# Patient Record
Sex: Female | Born: 1955
Health system: Southern US, Community
[De-identification: ages and names within clinical notes are randomized; demographics above are authoritative.]

## PROBLEM LIST (undated history)

## (undated) DIAGNOSIS — I341 Nonrheumatic mitral (valve) prolapse: Secondary | ICD-10-CM

## (undated) DIAGNOSIS — Z720 Tobacco use: Secondary | ICD-10-CM

## (undated) DIAGNOSIS — F988 Other specified behavioral and emotional disorders with onset usually occurring in childhood and adolescence: Secondary | ICD-10-CM

## (undated) DIAGNOSIS — I471 Supraventricular tachycardia, unspecified: Secondary | ICD-10-CM

## (undated) DIAGNOSIS — Z22322 Carrier or suspected carrier of Methicillin resistant Staphylococcus aureus: Secondary | ICD-10-CM

## (undated) DIAGNOSIS — M940 Chondrocostal junction syndrome [Tietze]: Secondary | ICD-10-CM

## (undated) DIAGNOSIS — F32A Depression, unspecified: Secondary | ICD-10-CM

## (undated) DIAGNOSIS — J449 Chronic obstructive pulmonary disease, unspecified: Secondary | ICD-10-CM

## (undated) DIAGNOSIS — G5603 Carpal tunnel syndrome, bilateral upper limbs: Secondary | ICD-10-CM

## (undated) DIAGNOSIS — M47817 Spondylosis without myelopathy or radiculopathy, lumbosacral region: Secondary | ICD-10-CM

## (undated) DIAGNOSIS — R002 Palpitations: Secondary | ICD-10-CM

## (undated) DIAGNOSIS — J189 Pneumonia, unspecified organism: Secondary | ICD-10-CM

## (undated) DIAGNOSIS — C3410 Malignant neoplasm of upper lobe, unspecified bronchus or lung: Secondary | ICD-10-CM

## (undated) DIAGNOSIS — IMO0001 Reserved for inherently not codable concepts without codable children: Secondary | ICD-10-CM

## (undated) DIAGNOSIS — G43909 Migraine, unspecified, not intractable, without status migrainosus: Secondary | ICD-10-CM

## (undated) DIAGNOSIS — A4902 Methicillin resistant Staphylococcus aureus infection, unspecified site: Secondary | ICD-10-CM

## (undated) DIAGNOSIS — F329 Major depressive disorder, single episode, unspecified: Secondary | ICD-10-CM

## (undated) DIAGNOSIS — R011 Cardiac murmur, unspecified: Secondary | ICD-10-CM

## (undated) DIAGNOSIS — M47812 Spondylosis without myelopathy or radiculopathy, cervical region: Secondary | ICD-10-CM

## (undated) DIAGNOSIS — I6529 Occlusion and stenosis of unspecified carotid artery: Secondary | ICD-10-CM

## (undated) DIAGNOSIS — K219 Gastro-esophageal reflux disease without esophagitis: Secondary | ICD-10-CM

## (undated) DIAGNOSIS — M797 Fibromyalgia: Secondary | ICD-10-CM

## (undated) HISTORY — DX: Occlusion and stenosis of unspecified carotid artery: I65.29

## (undated) HISTORY — DX: Palpitations: R00.2

## (undated) HISTORY — DX: Spondylosis without myelopathy or radiculopathy, lumbosacral region: M47.817

## (undated) HISTORY — DX: Reserved for inherently not codable concepts without codable children: IMO0001

## (undated) HISTORY — DX: Spondylosis without myelopathy or radiculopathy, cervical region: M47.812

## (undated) HISTORY — DX: Supraventricular tachycardia, unspecified: I47.10

## (undated) HISTORY — DX: Supraventricular tachycardia: I47.1

## (undated) HISTORY — DX: Nonrheumatic mitral (valve) prolapse: I34.1

## (undated) HISTORY — DX: Chronic obstructive pulmonary disease, unspecified: J44.9

## (undated) HISTORY — DX: Tobacco use: Z72.0

## (undated) HISTORY — DX: Fibromyalgia: M79.7

## (undated) HISTORY — DX: Carrier or suspected carrier of methicillin resistant Staphylococcus aureus: Z22.322

## (undated) HISTORY — PX: WISDOM TOOTH EXTRACTION: SHX21

## (undated) HISTORY — PX: CHOLECYSTECTOMY: SHX55

## (undated) HISTORY — DX: Gastro-esophageal reflux disease without esophagitis: K21.9

## (undated) HISTORY — PX: LUMBAR DISC SURGERY: SHX700

## (undated) HISTORY — DX: Other specified behavioral and emotional disorders with onset usually occurring in childhood and adolescence: F98.8

## (undated) HISTORY — DX: Chondrocostal junction syndrome (tietze): M94.0

## (undated) HISTORY — PX: INCISION AND DRAINAGE PERIRECTAL ABSCESS: SHX1804

## (undated) HISTORY — DX: Migraine, unspecified, not intractable, without status migrainosus: G43.909

## (undated) HISTORY — PX: TRIGGER FINGER RELEASE: SHX641

---

## 1999-03-18 ENCOUNTER — Other Ambulatory Visit: Admission: RE | Admit: 1999-03-18 | Discharge: 1999-03-18 | Payer: Self-pay | Admitting: Obstetrics and Gynecology

## 1999-06-15 ENCOUNTER — Other Ambulatory Visit: Admission: RE | Admit: 1999-06-15 | Discharge: 1999-06-15 | Payer: Self-pay | Admitting: Obstetrics and Gynecology

## 1999-12-08 ENCOUNTER — Other Ambulatory Visit: Admission: RE | Admit: 1999-12-08 | Discharge: 1999-12-08 | Payer: Self-pay | Admitting: Family Medicine

## 2000-09-26 ENCOUNTER — Ambulatory Visit (HOSPITAL_COMMUNITY): Admission: RE | Admit: 2000-09-26 | Discharge: 2000-09-26 | Payer: Self-pay | Admitting: Cardiology

## 2000-09-26 ENCOUNTER — Encounter: Payer: Self-pay | Admitting: Cardiology

## 2000-12-20 ENCOUNTER — Other Ambulatory Visit: Admission: RE | Admit: 2000-12-20 | Discharge: 2000-12-20 | Payer: Self-pay | Admitting: Obstetrics and Gynecology

## 2001-08-08 ENCOUNTER — Other Ambulatory Visit: Admission: RE | Admit: 2001-08-08 | Discharge: 2001-08-08 | Payer: Self-pay | Admitting: Obstetrics and Gynecology

## 2002-08-27 ENCOUNTER — Other Ambulatory Visit: Admission: RE | Admit: 2002-08-27 | Discharge: 2002-08-27 | Payer: Self-pay | Admitting: Obstetrics and Gynecology

## 2003-09-16 ENCOUNTER — Other Ambulatory Visit: Admission: RE | Admit: 2003-09-16 | Discharge: 2003-09-16 | Payer: Self-pay | Admitting: Obstetrics and Gynecology

## 2003-09-17 ENCOUNTER — Other Ambulatory Visit: Admission: RE | Admit: 2003-09-17 | Discharge: 2003-09-17 | Payer: Self-pay | Admitting: Obstetrics and Gynecology

## 2004-02-24 ENCOUNTER — Other Ambulatory Visit: Admission: RE | Admit: 2004-02-24 | Discharge: 2004-02-24 | Payer: Self-pay | Admitting: Obstetrics and Gynecology

## 2004-02-24 DIAGNOSIS — R8789 Other abnormal findings in specimens from female genital organs: Secondary | ICD-10-CM | POA: Insufficient documentation

## 2004-08-24 ENCOUNTER — Other Ambulatory Visit: Admission: RE | Admit: 2004-08-24 | Discharge: 2004-08-24 | Payer: Self-pay | Admitting: Obstetrics and Gynecology

## 2005-02-28 ENCOUNTER — Other Ambulatory Visit: Admission: RE | Admit: 2005-02-28 | Discharge: 2005-02-28 | Payer: Self-pay | Admitting: Obstetrics and Gynecology

## 2005-10-09 ENCOUNTER — Inpatient Hospital Stay (HOSPITAL_COMMUNITY): Admission: EM | Admit: 2005-10-09 | Discharge: 2005-10-14 | Payer: Self-pay | Admitting: *Deleted

## 2005-10-09 ENCOUNTER — Ambulatory Visit: Payer: Self-pay | Admitting: Internal Medicine

## 2005-11-15 ENCOUNTER — Ambulatory Visit: Payer: Self-pay | Admitting: Infectious Diseases

## 2006-01-02 ENCOUNTER — Other Ambulatory Visit: Admission: RE | Admit: 2006-01-02 | Discharge: 2006-01-02 | Payer: Self-pay | Admitting: Obstetrics and Gynecology

## 2007-01-08 ENCOUNTER — Encounter (INDEPENDENT_AMBULATORY_CARE_PROVIDER_SITE_OTHER): Payer: Self-pay | Admitting: Internal Medicine

## 2007-01-08 LAB — CONVERTED CEMR LAB: Pap Smear: NORMAL

## 2007-02-06 ENCOUNTER — Encounter (INDEPENDENT_AMBULATORY_CARE_PROVIDER_SITE_OTHER): Payer: Self-pay | Admitting: Internal Medicine

## 2007-02-27 DIAGNOSIS — R928 Other abnormal and inconclusive findings on diagnostic imaging of breast: Secondary | ICD-10-CM | POA: Insufficient documentation

## 2007-04-09 ENCOUNTER — Ambulatory Visit: Payer: Self-pay | Admitting: Internal Medicine

## 2007-04-09 DIAGNOSIS — G5600 Carpal tunnel syndrome, unspecified upper limb: Secondary | ICD-10-CM

## 2007-04-09 LAB — CONVERTED CEMR LAB
Creatinine, Ser: 0.75 mg/dL
Hemoglobin: 17.5 g/dL
LDL Cholesterol: 117 mg/dL
RBC: 5.26 M/uL

## 2007-04-26 ENCOUNTER — Ambulatory Visit: Payer: Self-pay | Admitting: Internal Medicine

## 2007-05-28 ENCOUNTER — Ambulatory Visit: Payer: Self-pay | Admitting: Internal Medicine

## 2007-06-27 ENCOUNTER — Ambulatory Visit: Payer: Self-pay | Admitting: Internal Medicine

## 2007-06-28 ENCOUNTER — Ambulatory Visit (HOSPITAL_COMMUNITY): Admission: RE | Admit: 2007-06-28 | Discharge: 2007-06-28 | Payer: Self-pay | Admitting: Internal Medicine

## 2007-08-09 ENCOUNTER — Telehealth (INDEPENDENT_AMBULATORY_CARE_PROVIDER_SITE_OTHER): Payer: Self-pay | Admitting: Internal Medicine

## 2007-08-10 ENCOUNTER — Encounter (INDEPENDENT_AMBULATORY_CARE_PROVIDER_SITE_OTHER): Payer: Self-pay | Admitting: Internal Medicine

## 2007-08-10 DIAGNOSIS — G43909 Migraine, unspecified, not intractable, without status migrainosus: Secondary | ICD-10-CM | POA: Insufficient documentation

## 2007-08-10 DIAGNOSIS — M5030 Other cervical disc degeneration, unspecified cervical region: Secondary | ICD-10-CM

## 2007-08-10 DIAGNOSIS — F329 Major depressive disorder, single episode, unspecified: Secondary | ICD-10-CM

## 2007-08-10 DIAGNOSIS — K5900 Constipation, unspecified: Secondary | ICD-10-CM | POA: Insufficient documentation

## 2007-08-10 DIAGNOSIS — G894 Chronic pain syndrome: Secondary | ICD-10-CM | POA: Insufficient documentation

## 2007-08-10 DIAGNOSIS — R42 Dizziness and giddiness: Secondary | ICD-10-CM | POA: Insufficient documentation

## 2007-08-10 DIAGNOSIS — I471 Supraventricular tachycardia: Secondary | ICD-10-CM

## 2007-08-19 ENCOUNTER — Telehealth (INDEPENDENT_AMBULATORY_CARE_PROVIDER_SITE_OTHER): Payer: Self-pay | Admitting: Internal Medicine

## 2007-08-23 ENCOUNTER — Ambulatory Visit: Payer: Self-pay | Admitting: Internal Medicine

## 2007-08-23 DIAGNOSIS — J40 Bronchitis, not specified as acute or chronic: Secondary | ICD-10-CM | POA: Insufficient documentation

## 2007-09-09 ENCOUNTER — Telehealth (INDEPENDENT_AMBULATORY_CARE_PROVIDER_SITE_OTHER): Payer: Self-pay | Admitting: Internal Medicine

## 2007-09-23 ENCOUNTER — Telehealth (INDEPENDENT_AMBULATORY_CARE_PROVIDER_SITE_OTHER): Payer: Self-pay | Admitting: *Deleted

## 2007-09-25 ENCOUNTER — Encounter (INDEPENDENT_AMBULATORY_CARE_PROVIDER_SITE_OTHER): Payer: Self-pay | Admitting: Internal Medicine

## 2007-10-15 ENCOUNTER — Encounter (INDEPENDENT_AMBULATORY_CARE_PROVIDER_SITE_OTHER): Payer: Self-pay | Admitting: Internal Medicine

## 2007-10-15 ENCOUNTER — Telehealth (INDEPENDENT_AMBULATORY_CARE_PROVIDER_SITE_OTHER): Payer: Self-pay | Admitting: Internal Medicine

## 2007-10-22 ENCOUNTER — Ambulatory Visit: Payer: Self-pay | Admitting: Internal Medicine

## 2007-10-22 DIAGNOSIS — R071 Chest pain on breathing: Secondary | ICD-10-CM

## 2007-11-05 ENCOUNTER — Telehealth (INDEPENDENT_AMBULATORY_CARE_PROVIDER_SITE_OTHER): Payer: Self-pay | Admitting: Internal Medicine

## 2007-12-10 ENCOUNTER — Encounter (INDEPENDENT_AMBULATORY_CARE_PROVIDER_SITE_OTHER): Payer: Self-pay | Admitting: Internal Medicine

## 2007-12-14 ENCOUNTER — Encounter (INDEPENDENT_AMBULATORY_CARE_PROVIDER_SITE_OTHER): Payer: Self-pay | Admitting: Internal Medicine

## 2007-12-17 ENCOUNTER — Telehealth (INDEPENDENT_AMBULATORY_CARE_PROVIDER_SITE_OTHER): Payer: Self-pay | Admitting: Internal Medicine

## 2008-01-03 ENCOUNTER — Encounter (INDEPENDENT_AMBULATORY_CARE_PROVIDER_SITE_OTHER): Payer: Self-pay | Admitting: Internal Medicine

## 2008-01-13 ENCOUNTER — Encounter (INDEPENDENT_AMBULATORY_CARE_PROVIDER_SITE_OTHER): Payer: Self-pay | Admitting: Internal Medicine

## 2008-10-13 ENCOUNTER — Ambulatory Visit: Payer: Self-pay | Admitting: Hematology & Oncology

## 2008-10-15 LAB — CHCC SATELLITE - SMEAR

## 2008-10-15 LAB — CBC WITH DIFFERENTIAL (CANCER CENTER ONLY)
BASO#: 0.1 10*3/uL (ref 0.0–0.2)
Eosinophils Absolute: 0.2 10*3/uL (ref 0.0–0.5)
HGB: 16.2 g/dL — ABNORMAL HIGH (ref 11.6–15.9)
LYMPH#: 3 10*3/uL (ref 0.9–3.3)
MCH: 34.7 pg — ABNORMAL HIGH (ref 26.0–34.0)
MONO#: 0.4 10*3/uL (ref 0.1–0.9)
NEUT#: 4.8 10*3/uL (ref 1.5–6.5)
Platelets: 327 10*3/uL (ref 145–400)
RBC: 4.67 10*6/uL (ref 3.70–5.32)
WBC: 8.4 10*3/uL (ref 3.9–10.0)

## 2008-10-19 LAB — VITAMIN B12: Vitamin B-12: 361 pg/mL (ref 211–911)

## 2008-10-19 LAB — IRON AND TIBC
%SAT: 18 % — ABNORMAL LOW (ref 20–55)
TIBC: 257 ug/dL (ref 250–470)

## 2008-10-19 LAB — RETICULOCYTES (CHCC)
ABS Retic: 43.3 10*3/uL (ref 19.0–186.0)
RBC.: 4.81 MIL/uL (ref 3.87–5.11)
Retic Ct Pct: 0.9 % (ref 0.4–3.1)

## 2008-10-19 LAB — HEMOGLOBINOPATHY EVALUATION: Hgb A: 95.3 % — ABNORMAL LOW (ref 96.8–97.8)

## 2008-11-23 ENCOUNTER — Ambulatory Visit (HOSPITAL_COMMUNITY): Admission: RE | Admit: 2008-11-23 | Discharge: 2008-11-24 | Payer: Self-pay | Admitting: Neurosurgery

## 2008-12-16 ENCOUNTER — Ambulatory Visit: Payer: Self-pay | Admitting: Hematology & Oncology

## 2008-12-17 LAB — CBC WITH DIFFERENTIAL (CANCER CENTER ONLY)
BASO#: 0.1 10*3/uL (ref 0.0–0.2)
BASO%: 1.1 % (ref 0.0–2.0)
EOS%: 2.4 % (ref 0.0–7.0)
HGB: 15 g/dL (ref 11.6–15.9)
LYMPH#: 3.2 10*3/uL (ref 0.9–3.3)
MCHC: 34.7 g/dL (ref 32.0–36.0)
NEUT#: 5.3 10*3/uL (ref 1.5–6.5)
Platelets: 248 10*3/uL (ref 145–400)

## 2008-12-17 LAB — FERRITIN: Ferritin: 106 ng/mL (ref 10–291)

## 2009-03-17 ENCOUNTER — Encounter: Admission: RE | Admit: 2009-03-17 | Discharge: 2009-03-17 | Payer: Self-pay | Admitting: Neurosurgery

## 2009-04-21 ENCOUNTER — Ambulatory Visit (HOSPITAL_COMMUNITY): Admission: RE | Admit: 2009-04-21 | Discharge: 2009-04-22 | Payer: Self-pay | Admitting: Neurosurgery

## 2009-05-11 ENCOUNTER — Ambulatory Visit: Payer: Self-pay | Admitting: Hematology & Oncology

## 2009-05-11 LAB — CBC WITH DIFFERENTIAL (CANCER CENTER ONLY)
BASO#: 0 10*3/uL (ref 0.0–0.2)
EOS%: 2.1 % (ref 0.0–7.0)
Eosinophils Absolute: 0.2 10*3/uL (ref 0.0–0.5)
HGB: 13.9 g/dL (ref 11.6–15.9)
LYMPH#: 3.1 10*3/uL (ref 0.9–3.3)
MCHC: 33.8 g/dL (ref 32.0–36.0)
NEUT#: 3.9 10*3/uL (ref 1.5–6.5)
Platelets: 275 10*3/uL (ref 145–400)
RBC: 4.15 10*6/uL (ref 3.70–5.32)

## 2009-09-10 ENCOUNTER — Encounter: Admission: RE | Admit: 2009-09-10 | Discharge: 2009-09-10 | Payer: Self-pay | Admitting: Internal Medicine

## 2009-11-10 ENCOUNTER — Encounter: Admission: RE | Admit: 2009-11-10 | Discharge: 2009-11-10 | Payer: Self-pay | Admitting: Obstetrics and Gynecology

## 2010-06-24 ENCOUNTER — Encounter: Admission: RE | Admit: 2010-06-24 | Discharge: 2010-06-24 | Payer: Self-pay | Admitting: Gastroenterology

## 2010-10-07 ENCOUNTER — Emergency Department (HOSPITAL_COMMUNITY): Admission: EM | Admit: 2010-10-07 | Discharge: 2010-10-08 | Payer: Self-pay | Admitting: Emergency Medicine

## 2010-10-07 ENCOUNTER — Ambulatory Visit (HOSPITAL_COMMUNITY): Admission: RE | Admit: 2010-10-07 | Discharge: 2010-10-07 | Payer: Self-pay | Admitting: General Surgery

## 2011-01-03 NOTE — Progress Notes (Signed)
Summary: NEEDS A REF. TO SEE ENT DR  Phone Note Call from Patient   Summary of Call: [name] PT. SAY'S  THAT DR. HICKLING 'S ASSISTANT CALLED HER LAST NIGHT AND SAID THAT SHE MAY HAVE MENIERES DISEASE AND [name] SAYS THAT SHE WOULD LIKE TO SEE DR. BYERS AT [facility] AND SHE NEEDS [place] TO DO THE REFERRAL. Initial call taken by: Leodis Rains,  October 15, 2007 11:29 AM  Follow-up for Phone Call        Left message on answering machine for pt to return call  Follow-up by: Vesta Mixer CMA,  October 16, 2007 2:58 PM  Additional Follow-up for Phone Call Additional follow up Details #1::        Spoke with pt.  Dr. Sharene Skeans wants her to see ENT for possible Meniers Dx.  She has not seen anyone before for this.  She does currently have medicaid.  She also has a f/u appt with you this coming Tuesday.  If it is ok to make ENT appt.  Will you order in EMR. Additional Follow-up by: Vesta Mixer CMA,  October 17, 2007 9:08 AM

## 2011-02-14 LAB — CBC
HCT: 40.8 % (ref 36.0–46.0)
Hemoglobin: 15.8 g/dL — ABNORMAL HIGH (ref 12.0–15.0)
MCH: 33.5 pg (ref 26.0–34.0)
MCV: 97.6 fL (ref 78.0–100.0)
Platelets: 198 10*3/uL (ref 150–400)
RBC: 4.69 MIL/uL (ref 3.87–5.11)
RDW: 12.6 % (ref 11.5–15.5)
WBC: 8.3 10*3/uL (ref 4.0–10.5)

## 2011-02-14 LAB — DIFFERENTIAL
Basophils Absolute: 0 10*3/uL (ref 0.0–0.1)
Basophils Relative: 0 % (ref 0–1)
Lymphocytes Relative: 10 % — ABNORMAL LOW (ref 12–46)
Monocytes Absolute: 0.6 10*3/uL (ref 0.1–1.0)
Neutro Abs: 14.1 10*3/uL — ABNORMAL HIGH (ref 1.7–7.7)
Neutrophils Relative %: 86 % — ABNORMAL HIGH (ref 43–77)

## 2011-02-14 LAB — COMPREHENSIVE METABOLIC PANEL
ALT: 12 U/L (ref 0–35)
AST: 64 U/L — ABNORMAL HIGH (ref 0–37)
Alkaline Phosphatase: 80 U/L (ref 39–117)
BUN: 6 mg/dL (ref 6–23)
CO2: 26 mEq/L (ref 19–32)
CO2: 26 mEq/L (ref 19–32)
Calcium: 8.5 mg/dL (ref 8.4–10.5)
Chloride: 106 mEq/L (ref 96–112)
Creatinine, Ser: 0.68 mg/dL (ref 0.4–1.2)
GFR calc non Af Amer: >60 mL/min (ref >60)
GFR calc non Af Amer: >60 mL/min (ref >60)
Glucose, Bld: 85 mg/dL (ref 70–99)
Potassium: 3.8 mEq/L (ref 3.5–5.1)
Sodium: 138 mEq/L (ref 135–145)

## 2011-02-24 ENCOUNTER — Other Ambulatory Visit: Payer: Self-pay | Admitting: Gastroenterology

## 2011-03-14 LAB — CBC
HCT: 48.5 % — ABNORMAL HIGH (ref 36.0–46.0)
Hemoglobin: 16.6 g/dL — ABNORMAL HIGH (ref 12.0–15.0)
MCHC: 34.3 g/dL (ref 30.0–36.0)
MCV: 99.5 fL (ref 78.0–100.0)
Platelets: 232 10*3/uL (ref 150–400)
RBC: 4.88 MIL/uL (ref 3.87–5.11)
RDW: 13.1 % (ref 11.5–15.5)
WBC: 9.6 10*3/uL (ref 4.0–10.5)

## 2011-04-18 NOTE — Op Note (Signed)
[name], [name]         ACCOUNT NO.:  [id number]   MEDICAL RECORD NO.:  [id number]          PATIENT TYPE:  OIB   LOCATION:  3533                         FACILITY:  [facility]   PHYSICIAN:  Hewitt Shorts, M.D.DATE OF BIRTH:  1956/09/17   DATE OF PROCEDURE:  04/21/2009  DATE OF DISCHARGE:                               OPERATIVE REPORT   PREOPERATIVE DIAGNOSES:  1. Recurrent left L5-S1 lumbar disk herniation.  2. Lumbar degenerative disk disease.  3. Lumbar spondylosis.  4. Lumbar radiculopathy.   POSTOPERATIVE DIAGNOSES:  1. Recurrent left L5-S1 lumbar disk herniation.  2. Lumbar degenerative disk disease.  3. Lumbar spondylosis.  4. Lumbar radiculopathy.   PROCEDURE:  Left L5-S1 lumbar laminotomy and microdiskectomy with  microdissection.   SURGEON:  Hewitt Shorts, MD.   ASSISTANT:  Danae Orleans. Venetia Maxon, MD.   ANESTHESIA:  General endotracheal.   INDICATIONS:  The patient is a 55 year old woman who presents about 5-  month status post a left L5-S1 lumbar laminotomy and microdiskectomy.  She did well when about 2 months ago she developed recurrent left lumbar  radicular pain.  MRI was repeated and revealed a left L5-S1 recurrent  lumbar disk herniation and decision was made to proceed with laminotomy  and microdiskectomy.   PROCEDURE:  The patient was brought to the operating room and placed  under general endotracheal anesthesia.  The patient was turned to a  prone position.  Lumbar region was prepped with Betadine soap and  solution and draped in a sterile fashion.  The midline was infiltrated  with local anesthetic with epinephrine and the previous midline incision  was reopened and incision was carried down through the subcutaneous  tissue.  Bipolar cautery and electrocautery was used to maintain  hemostasis.  Dissection was carried down to the lumbar fascia, which was  incised on the left side of the midline, and the paraspinal muscles were  dissected from  the spinous process and lamina in a subperiosteal  fashion, taking care around the previous laminotomy sites.   The operating microscope was draped and brought to the field to provide  additional navigation, illumination, and visualization.  The remainder  of the decompression was performed using microdissection and  microsurgical technique.  We defined the margins of the previous  laminotomy and began to dissect the scar tissue from the edges of  laminotomy.  We then began our dissection into the lateral epidural  space.  Laminotomy was extended superiorly and laterally using the X-max  drill and Kerrison punches.  We dissected down to the annulus and large  vent in the annulus was present.  We entered into the disk space using a  variety of pituitary rongeurs and began the diskectomy.  The disk  herniation was felt to be medial to the additional exposure and began to  mobilize the herniated disk fragment using coronary dilator and Epstein  microcurettes and were eventually able to mobilize and remove the  recurrent disk herniation, which was embedded within the scar tissue.  A  third diskectomy was performed removing all loose fragments of the disk  material from both the disk space and  the epidural space.  Good  decompression of thecal sac and nerve roots was achieved.  Once the  diskectomy was completed, hemostasis was established with the use of  bipolar cautery and Gelfoam soaked in thrombin.  The Gelfoam was  removed.  Hemostasis reconfirmed.  We had irrigated the wound numerous  times in the procedure with Bacitracin solution.  Once we finished the  procedure and we were about to close, we instilled 2 mL of fentanyl and  80 mg of Depo-Medrol into the epidural space and then proceeded with  closure.  The deep fascia was closed with interrupted undyed #1 Vicryl  sutures.  The subcutaneous and subcuticular were closed with interrupted  inverted 2-0 undyed Vicryl sutures.  The skin  was reapproximated with  Dermabond.  The procedure was tolerated well.  The estimated blood loss  was 25 mL.  Sponge and needle count were correct.  Following surgery,  the patient was returned back to the supine position, to be reversed  from the anesthetic, extubated, and transferred to the recovery room for  further care.      Hewitt Shorts, M.D.  Electronically Signed     RWN/MEDQ  D:  04/21/2009  T:  04/22/2009  Job:  [id number]

## 2011-04-18 NOTE — Op Note (Signed)
[name], [name]         ACCOUNT NO.:  [id number]   MEDICAL RECORD NO.:  [id number]          PATIENT TYPE:  INP   LOCATION:  2899                         FACILITY:  [facility]   PHYSICIAN:  Hewitt Shorts, M.D.DATE OF BIRTH:  12/08/1955   DATE OF PROCEDURE:  11/23/2008  DATE OF DISCHARGE:                               OPERATIVE REPORT   PREOPERATIVE DIAGNOSES:  1. L5-S1 lumbar disk herniation.  2. Lumbar degenerative disk disease  3. Lumbar spondylosis.  4. Lumbar radiculopathy.   POSTOPERATIVE DIAGNOSES:  1. L5-S1 lumbar disk herniation.  2. Lumbar degenerative disk disease.  3. Lumbar spondylosis.  4. Lumbar radiculopathy.   PROCEDURES:  Left L5-S1 lumbar laminotomy and microdiskectomy with  microdissection.   SURGEON:  Hewitt Shorts, MD   ASSISTANT:  Nelia Shi. Webb Silversmith, NP, and Payton Doughty, M.D.   ANESTHESIA:  General endotracheal.   INDICATIONS:  The patient is a 55 year old woman who presented with left  lumbar radiculopathy.  She is found to have small left L5-S1 lumbar disk  herniation which extended caudally on the body of S1 with overlying  hypertrophic facet arthropathy causing left S1 nerve root compression  both ventrally and dorsally.  I did epidural steroid injections which  gave transient but not lasting relief and the decision was made to  proceed with decompression.   PROCEDURE:  The patient was brought to the operating room and placed  under general endotracheal anesthesia.  The patient was turned to a  prone position.  Lumbar region was prepped with Betadine soap and  solution and draped in a sterile fashion.  The midline was infiltrated  with local anesthetic with epinephrine and x-ray was taken.  The L5-S1  level was identified and a midline incision was made over the L5-S1  level and carried down through the subcutaneous tissue.  Bipolar  electrocautery was used to maintain hemostasis.  Dissection was carried  down to the lumbar fascia,  which was incised on the left side of the  midline.  The paraspinal muscles were dissected from the spinous process  and lamina in a subperiosteal fashion. The L5-S1 intralaminar space was  identified and another x-ray was taken to confirm the localization.  And  then, the microscope was draped and brought to the field to provide  additional navigation, visualization. The remainder of the decompression  was performed using microdissection and microsurgical technique.   Laminotomy was performed using the X-Max drill and Kerrison punches.  The ligamentum flavum was carefully removed and we identified the thecal  sac and exiting left S1 nerve root.  The left L5-S1 facet joint was  arthropathic and hypertrophic, and we are able to carefully remove this  because it was causing significant compression of the dorsal lateral  aspect of the nerve root.  We then gently retracted the thecal sac and  nerve root immediately exposing the disk herniation which was  spondylotic in nature as well.  The disk herniation was removed.  We  entered into the disk space, continued with a thorough diskectomy using  variety of microcurettes and pituitary rongeurs.  Good decompression of  thecal sac nerve  root was achieved.  All loose fragments and disk  materials were removed from the disk space and epidural space.  The  wound was irrigated with Bacitracin solution and checked for hemostasis,  which was established and confirmed, and then we instilled 2 mL of  fentanyl and 80 mg of Depo-Medrol into the epidural space and proceeded  with closure.  The deep fascia was closed with interrupted undyed #1  Vicryl sutures. Scarpa fascia was closed with interrupted undyed #1  Vicryl sutures. The subcutaneous and subcuticular were closed with  interrupted inverted 2-0 undyed Vicryl sutures. The skin was  approximated with Dermabond. The procedure was tolerated well. Estimated  blood loss was 10 mL.  Sponge and needle count  were correct. Following  surgery, the patient was turned back to the supine position to be  reversed from the anesthetic, extubated, and transferred to the recovery  room for further care.      Hewitt Shorts, M.D.  Electronically Signed     RWN/MEDQ  D:  11/23/2008  T:  11/23/2008  Job:  [id number]

## 2011-04-21 NOTE — Discharge Summary (Signed)
[name], [name]         ACCOUNT NO.:  [id number]   MEDICAL RECORD NO.:  [id number]          PATIENT TYPE:  INP   LOCATION:  1619                         FACILITY:  [facility]   PHYSICIAN:  [name], MD      DATE OF BIRTH:  Dec 08, 1955   DATE OF ADMISSION:  10/09/2005  DATE OF DISCHARGE:  10/14/2005                                 DISCHARGE SUMMARY   REASON FOR ADMISSION:  Perirectal abscess with cellulitis.   HISTORY OF PRESENT ILLNESS:  Patient is a 55 year old white female from  [place], [place] referred to the emergency department by Dr. Henderson Cloud  at [facility] for perirectal abscess and cellulitis. The patient  had symptoms for five to six days prior to admission. The patient was  evaluated by Dr. Mariel Aloe. She was found to have an elevated white blood  cell count of 20,000. General surgery was consulted.   The patient was admitted on the general surgical service, taken directly to  the operating room where she underwent incision and drainage of a large  perirectal abscess. Postoperatively, she was treated with intravenous  antibiotics. Culture results revealed methicillin-resistant staph aureus.  She was seen in consultation by Dr. Cliffton Asters from infectious disease.  The patient's antibiotics were changed from intravenous Unasyn to  intravenous vancomycin. The patient defervesced. White blood cell count  returned to normal. She was prepared for discharge home on November 11.   DISCHARGE/PLAN:  The patient is discharged home October 14, 2005 in good  condition, tolerating a regular diet, and ambulating independently. [facility]  [facility] home health nurses will assist with dressing changes at home on a  daily basis. The patient's discharge medications include doxycycline 100 mg  p.o. b.i.d. for 14 days and Percocet as needed for pain.   FINAL DIAGNOSIS:  Perirectal abscess, methicillin-resistant staph aureus,  cellulitis.   CONDITION AT  DISCHARGE:  Improved.      [name], MD  Electronically Signed     TMG/MEDQ  D:  10/14/2005  T:  10/14/2005  Job:  [id number]   cc:   Carrington Clamp, M.D.  Fax: [phone]   Sheppard Penton. Stacie Acres, M.D.  Fax: [phone]   Cliffton Asters, M.D.  Fax: [phone]

## 2011-04-21 NOTE — H&P (Signed)
[name], [name]         ACCOUNT NO.:  [id number]   MEDICAL RECORD NO.:  [id number]          PATIENT TYPE:  EMS   LOCATION:  ED                           FACILITY:  [facility]   PHYSICIAN:  [name], MD      DATE OF BIRTH:  05-24-1956   DATE OF ADMISSION:  10/09/2005  DATE OF DISCHARGE:                                HISTORY & PHYSICAL   REFERRED BY:  Sheppard Penton. Stacie Acres, M.D., emergency department.   CHIEF COMPLAINT:  Perirectal abscess, cellulitis.   HISTORY OF PRESENT ILLNESS:  The patient is 55 year old white female from  [place], [place] who presents to the emergency department after  evaluation at the office of Dr. Henderson Cloud at [facility] for what  appears to be a perirectal abscess and cellulitis. The patient apparently  has had 5-6 days of developing symptoms. She had increased pain. She  developed fevers and chills. She saw Dr. Henderson Cloud at [facility]  today and was referred to the emergency department at [facility]. The patient was assessed by Dr. Stacie Acres. Laboratory studies  showed  a white count of 20,000 with 92% neutrophils. The patient has a fever to  101.4 degrees. General surgery is now called for evaluation of what appears  to be a large left perirectal abscess.   The patient has had previous soft-tissue and skin infections over the past  year. She has been on and off of antibiotics. The patient denies any  anorectal disease.   PAST MEDICAL HISTORY:  History of mitral valve prolapse followed by Dr. Caprice Kluver, history of migraine headaches, history of laparoscopy for abdominal  pain.   MEDICATIONS:  Fioricet, ibuprofen, Cymbalta, Xanax.   ALLERGIES:  CARAFATE, CECLOR, MACRODANTIN.   SOCIAL HISTORY:  [place] She is  accompanied by her mother. She smokes cigarettes. She drinks alcohol. She  denies drug use.   REVIEW OF SYMPTOMS:  A 15 system review without significant other  positives  except as noted above.   PHYSICAL EXAMINATION:  GENERAL:  A 55 year old, well-developed, white female 55 on a stretcher in the emergency department.  VITAL SIGNS:  Temperature 101.4, blood pressure 89/60, pulse 129,  respirations 23, O2 saturation 97%.  HEENT:  Shows her to be normocephalic, atraumatic. Sclerae clear.  Conjunctiva clear. Pupils equal and reactive. Dentition fair to poor, mucous  membranes moist. Voice is normal. Palpation of the neck shows no thyroid  nodularity. There is no anterior or posterior cervical lymphadenopathy.  LUNGS:  Clear to auscultation bilaterally without rales, rhonchi or wheeze.  CARDIAC:  Shows tachycardia without significant murmur. Peripheral pulses  are full.  ABDOMEN:  Soft, nontender, scaphoid with bowel sounds present. PERINEUM:  Examination of the a perineum shows a large indurated, erythematous mass  involving the medial left buttock. This was exquisitely tender. It is  difficult to assess whether there is fluctuance but there is significant  induration. Erythema extends into the left labia majora with moderate soft  tissue swelling. There are multiple petechial lesions around the left thigh  and groin region.  EXTREMITIES:  Nontender without edema.  NEUROLOGIC:  Neurologically the patient is alert and oriented without focal  deficit.   LABORATORY STUDIES:  White count 20.3, hemoglobin 13.6, hematocrit 38.1%,  platelet count 212,000. Differential shows 92% neutrophils, 3% lymphocytes,  5% monocytes. Chemistry profile notable for sodium of 130 and potassium of  3.0 and creatinine of 1.6.   IMPRESSION:  Perirectal abscess, cellulitis.   PLAN:  The patient be admitted on the general surgical service. She has been  started on Unasyn intravenously in the emergency department. After  discussion with the patient and her family she will be prepared for the  operating room to undergo examination under anesthesia and incision and   drainage of perirectal abscess. The patient will likely require 24-48 hours  of hospitalization for intravenous antibiotic administration and wound care.  The patient understands and wishes to proceed.      [name], MD  Electronically Signed     TMG/MEDQ  D:  10/09/2005  T:  10/09/2005  Job:  [id number]   cc:   Carrington Clamp, M.D.  Fax: [phone]

## 2011-04-21 NOTE — Op Note (Signed)
[name], FIGLER         ACCOUNT NO.:  [id number]   MEDICAL RECORD NO.:  [id number]          PATIENT TYPE:  INP   LOCATION:  0101                         FACILITY:  [facility]   PHYSICIAN:  [name], MD      DATE OF BIRTH:  08-Jun-1956   DATE OF PROCEDURE:  10/09/2005  DATE OF DISCHARGE:                                 OPERATIVE REPORT   PREOPERATIVE DIAGNOSIS:  Perirectal abscess, cellulitis.   POSTOPERATIVE DIAGNOSIS:  Perirectal abscess, cellulitis.   PROCEDURE:  Incision, drainage and open packing of perirectal abscess.   SURGEON:  [name], MD.   ANESTHESIA:  General per Dr. Lucille Passy, MD.   PREPARATION:  Betadine.   COMPLICATIONS:  None.   BLOOD LOSS:  Minimal.   INDICATIONS:  The patient is a 55 year old white female from [place], [place] who presents with 5-6 day history of developing infection in the  perineum. She was seen by Dr. Henderson Cloud at [facility] and sent to the  emergency department. The patient was seen by Dr. Mariel Aloe and general  surgery was consulted for management.   DESCRIPTION OF PROCEDURE:  The procedure was done in OR #1 at the [facility]. The patient is brought to the operating room,  placed in a supine position on the operating room table. Following the  administration of general anesthesia, the patient is placed in lithotomy.  There is obvious cellulitis involving the left medial thigh, the left labia,  and extending into the left medial buttock. Digital rectal exam shows  fluctuance along the left lateral rectal wall. An area on the skin which  appears to be mildly fluctuant is selected on the left buttock. This is  incised with the electrocautery. Thick yellow brown pus is evident. The  incision is extended for 3 cm. Using a digital exam, multiple loculations of  a very large perirectal abscess are broken up. The abscess extends upwards  along the rectal wall above the level of  the pelvic levators. It extends  anteriorly into the left labia. It extends posteriorly towards the coccyx in  the perirectal tissues. Aerobic and anaerobic cultures are taken and  submitted to  the laboratory. The wound was irrigated copiously with warm saline. The  cavity is then packed with 1-inch Iodoform gauze packing and dry gauze  dressings and ABD pads are placed. The patient is taken out of lithotomy and  brought to the recovery room. The patient tolerated the procedure well.      [name], MD  Electronically Signed     TMG/MEDQ  D:  10/09/2005  T:  10/10/2005  Job:  [id number]   cc:   Carrington Clamp, M.D.  Fax: [phone]

## 2011-05-12 ENCOUNTER — Other Ambulatory Visit: Payer: Self-pay | Admitting: Obstetrics and Gynecology

## 2011-09-08 LAB — BASIC METABOLIC PANEL
BUN: 9 mg/dL (ref 6–23)
CO2: 29 mEq/L (ref 19–32)
Calcium: 9.4 mg/dL (ref 8.4–10.5)
Chloride: 99 mEq/L (ref 96–112)
Creatinine, Ser: 0.63 mg/dL (ref 0.4–1.2)
GFR calc Af Amer: >60 mL/min (ref >60)
GFR calc non Af Amer: >60 mL/min (ref >60)
Glucose, Bld: 84 mg/dL (ref 70–99)
Potassium: 3.8 mEq/L (ref 3.5–5.1)
Sodium: 136 mEq/L (ref 135–145)

## 2011-09-08 LAB — CBC
HCT: 49.5 % — ABNORMAL HIGH (ref 36.0–46.0)
Hemoglobin: 16.4 g/dL — ABNORMAL HIGH (ref 12.0–15.0)
MCHC: 33.2 g/dL (ref 30.0–36.0)
MCV: 101.5 fL — ABNORMAL HIGH (ref 78.0–100.0)
Platelets: 242 10*3/uL (ref 150–400)
RBC: 4.87 MIL/uL (ref 3.87–5.11)
RDW: 13.4 % (ref 11.5–15.5)
WBC: 10.6 10*3/uL — ABNORMAL HIGH (ref 4.0–10.5)

## 2012-09-10 LAB — PULMONARY FUNCTION TEST

## 2013-03-05 ENCOUNTER — Encounter: Payer: Self-pay | Admitting: *Deleted

## 2013-03-05 ENCOUNTER — Encounter: Payer: Self-pay | Admitting: Cardiovascular Disease

## 2013-04-18 ENCOUNTER — Encounter: Payer: Self-pay | Admitting: Nurse Practitioner

## 2013-04-18 ENCOUNTER — Ambulatory Visit (INDEPENDENT_AMBULATORY_CARE_PROVIDER_SITE_OTHER): Payer: Medicare Other | Admitting: Nurse Practitioner

## 2013-04-18 VITALS — BP 121/73 | HR 85 | Ht 68.0 in | Wt 172.0 lb

## 2013-04-18 DIAGNOSIS — IMO0001 Reserved for inherently not codable concepts without codable children: Secondary | ICD-10-CM | POA: Insufficient documentation

## 2013-04-18 DIAGNOSIS — G43909 Migraine, unspecified, not intractable, without status migrainosus: Secondary | ICD-10-CM

## 2013-04-18 DIAGNOSIS — M47812 Spondylosis without myelopathy or radiculopathy, cervical region: Secondary | ICD-10-CM | POA: Insufficient documentation

## 2013-04-18 MED ORDER — FIORICET 50-300-40 MG PO CAPS: ORAL_CAPSULE | ORAL | Status: AC

## 2013-04-18 NOTE — Patient Instructions (Addendum)
Patient given Fioricet prescription for one month, Pain management will manage her prescriptions from now on Neurologic exam completely normal, Previous MRI of the brain normal Previous EEG was normal Followup when necessary

## 2013-04-18 NOTE — Progress Notes (Signed)
HPI: Patient returns for followup after last visit with Dr. Terrace Arabia 10/16/2012. Patient was previously seen by Dr. Sharene Skeans who has left the practice. She has multiple medical problems  including chronic intractable migraine, depression and anxiety, chronic pain syndrome that is managed by pain management, and fibromyalgia. Her headaches when present are severe persistent and pounding, she has taken multiple preventives in the past. Fioricet is the only medication that has helped her. She has been on triptans and anti-seizure medications in the past without benefit. Some of her headaches originate in the shoulder and neck area.  ROS:  weight gain, fatigue, chest pain, palpitations, murmur, ringing in ears, spinning sensation, moles, constipation, easy brusing, feeling hot, feeling cold, increased thrist, joint swelling, aching muscles, memory loss, headache, numbness, sleepiness, dizziness, passing out, depression, too much sleep, decreased energy, change in appetite, disinterest in activities  Physical Exam General: well developed, well nourished, seated, in no evident distress Head: head normocephalic and atraumatic. Oropharynx benign Neck: supple with no carotid or supraclavicular bruits Cardiovascular: regular rate and rhythm, no murmurs  Neurologic Exam Mental Status: Awake and fully alert. Oriented to place and time. Follows all commands Mood and affect appropriate.  Cranial Nerves: Fundoscopic exam reveals sharp disc margins. Pupils equal, briskly reactive to light. Extraocular movements full without nystagmus. Visual fields full to confrontation. Hearing intact and symmetric to finger snap. Facial sensation intact. Face, tongue, palate move normally and symmetrically.   Motor: Normal bulk and tone. Normal strength in all tested extremity muscles. No focal weakness Sensory.: intact to touch and pinprick and vibratory in all extremities.  Coordination: Rapid alternating movements normal in all  extremities. Finger-to-nose and heel-to-shin performed accurately bilaterally. Gait and Station: Arises from chair without difficulty. Stance is normal. Gait demonstrates normal stride length and balance . Able to heel, toe and tandem walk without difficulty.  Reflexes: 2+ and symmetric. Toes downgoing.     ASSESSMENT: Intractable mixed headache disorder with her symptomology being stable over time. Multiple preventives have been tried in the past and  failed to relieve her headaches. She is wishing to transfer her Fioricet prescription to the pain clinic if that is okay with this clinic. She has a normal neurologic exam     PLAN:  Patient given Fioricet prescription for one month, Pain management will manage her prescriptions from now on Neurologic exam completely normal, Previous MRI of the brain normal Previous EEG was normal Followup when necessary for other neurologic problems  Nilda Riggs, GNP-BC APRN

## 2013-09-17 ENCOUNTER — Other Ambulatory Visit: Payer: Self-pay | Admitting: Neurosurgery

## 2013-09-17 DIAGNOSIS — M47816 Spondylosis without myelopathy or radiculopathy, lumbar region: Secondary | ICD-10-CM

## 2013-09-17 DIAGNOSIS — M47812 Spondylosis without myelopathy or radiculopathy, cervical region: Secondary | ICD-10-CM

## 2013-10-08 ENCOUNTER — Ambulatory Visit
Admission: RE | Admit: 2013-10-08 | Discharge: 2013-10-08 | Disposition: A | Discharge status: Home / Self Care | Payer: Medicare Other | Source: Ambulatory Visit | Attending: Neurosurgery | Admitting: Neurosurgery

## 2013-10-08 DIAGNOSIS — M47816 Spondylosis without myelopathy or radiculopathy, lumbar region: Secondary | ICD-10-CM

## 2013-10-08 DIAGNOSIS — M47812 Spondylosis without myelopathy or radiculopathy, cervical region: Secondary | ICD-10-CM

## 2013-10-08 MED ORDER — GADOBENATE DIMEGLUMINE 529 MG/ML IV SOLN
15.0000 mL | Freq: Once | INTRAVENOUS | Status: AC | PRN
  Administered 2013-10-08: 15 mL via INTRAVENOUS

## 2013-12-05 ENCOUNTER — Telehealth (HOSPITAL_COMMUNITY): Payer: Self-pay | Admitting: *Deleted

## 2013-12-15 ENCOUNTER — Other Ambulatory Visit (HOSPITAL_COMMUNITY): Payer: Self-pay | Admitting: Cardiovascular Disease

## 2013-12-15 ENCOUNTER — Telehealth (HOSPITAL_COMMUNITY): Payer: Self-pay | Admitting: *Deleted

## 2014-01-08 ENCOUNTER — Encounter (HOSPITAL_COMMUNITY): Payer: Medicare Other

## 2014-01-19 ENCOUNTER — Ambulatory Visit (HOSPITAL_COMMUNITY)
Admission: RE | Admit: 2014-01-19 | Discharge: 2014-01-19 | Disposition: A | Discharge status: Home / Self Care | Payer: Medicare Other | Source: Ambulatory Visit | Attending: Internal Medicine | Admitting: Internal Medicine

## 2014-01-19 DIAGNOSIS — I6529 Occlusion and stenosis of unspecified carotid artery: Secondary | ICD-10-CM | POA: Insufficient documentation

## 2014-01-19 NOTE — Progress Notes (Signed)
Carotid Duplex Completed. Laportia Carley, BS, RDMS, RVT  

## 2014-01-23 ENCOUNTER — Telehealth: Payer: Self-pay | Admitting: *Deleted

## 2014-01-23 DIAGNOSIS — I6529 Occlusion and stenosis of unspecified carotid artery: Secondary | ICD-10-CM

## 2014-01-23 NOTE — Telephone Encounter (Signed)
Carotid doppler results called to patient.  Voiced understanding.  Will repeat in one year.  Order placed.

## 2014-01-23 NOTE — Telephone Encounter (Signed)
Message copied by Tressa Busman on Fri Jan 23, 2014  8:44 AM ------      Message from: Sanda Klein      Created: Thu Jan 22, 2014  7:45 PM       Carotids with mild blockage, maybe a little worse than last year on the left. Repeat in 12 months please ------

## 2014-03-02 ENCOUNTER — Ambulatory Visit: Payer: Medicare Other | Admitting: Cardiovascular Disease

## 2014-04-07 ENCOUNTER — Ambulatory Visit (INDEPENDENT_AMBULATORY_CARE_PROVIDER_SITE_OTHER): Payer: Medicare Other | Admitting: Cardiovascular Disease

## 2014-04-07 ENCOUNTER — Encounter: Payer: Self-pay | Admitting: Cardiovascular Disease

## 2014-04-07 VITALS — BP 122/78 | HR 69 | Resp 16 | Ht 67.0 in | Wt 166.4 lb

## 2014-04-07 DIAGNOSIS — E789 Disorder of lipoprotein metabolism, unspecified: Secondary | ICD-10-CM

## 2014-04-07 DIAGNOSIS — Z8249 Family history of ischemic heart disease and other diseases of the circulatory system: Secondary | ICD-10-CM

## 2014-04-07 DIAGNOSIS — I471 Supraventricular tachycardia: Secondary | ICD-10-CM

## 2014-04-07 NOTE — Patient Instructions (Signed)
Your physician recommends that you schedule a follow-up appointment in: 12 months with Dr.Croitoru

## 2014-04-08 ENCOUNTER — Encounter: Payer: Self-pay | Admitting: Cardiovascular Disease

## 2014-04-08 DIAGNOSIS — Z8249 Family history of ischemic heart disease and other diseases of the circulatory system: Secondary | ICD-10-CM | POA: Insufficient documentation

## 2014-04-08 DIAGNOSIS — E789 Disorder of lipoprotein metabolism, unspecified: Secondary | ICD-10-CM | POA: Insufficient documentation

## 2014-04-08 NOTE — Progress Notes (Signed)
Patient ID: [name], female   DOB: 1956-01-06, 58 y.o.   MRN: [id number]      Reason for office visit Atrial tachycardia  [name] returns in followup for palpitations related to ectopic atrial tachycardia. She also has a history of migraines, fibromyalgia, mild dyslipidemia and a very strong family history of coronary disease. She is trying to quit smoking. She is now "vaping" and this has helped her to reduce her cigarette use a roughly 4 a day.  He has not been troubled by sustained palpitations she has very rare episodes of "hop skip and jump". She has not had syncope, dizziness, shortness of breath, chest pain or any other cardiac symptoms. He had a normal nuclear stress test in 2013.  Previous pulmonary function tests have shown evidence of abnormal lung function with moderate obstructive abnormalities. FEV1 was 64% of predicted  She is still taking red yeast rice for hyperlipidemia.   Allergies  Allergen Reactions  . Carafate [Sucralfate]   . Cefaclor     REACTION: Hives and swelling  . Nitrofurantoin     REACTION: Hives and swelling  . Wellbutrin [Bupropion]     Current Outpatient Prescriptions  Medication Sig Dispense Refill  . Ascorbic Acid (VITAMIN C) 1000 MG tablet Take 1,000 mg by mouth 2 (two) times daily.      . Calcium Carbonate-Vit D-Min (CALCIUM 1200 PO) Take 1,200 mg by mouth 2 (two) times daily.      Marland Kitchen dexlansoprazole (DEXILANT) 60 MG capsule Take 60 mg by mouth daily.      . diclofenac (CATAFLAM) 50 MG tablet Take 50 mg by mouth 2 (two) times daily.      Marland Kitchen docusate sodium (COLACE) 100 MG capsule Take 100 mg by mouth 2 (two) times daily as needed for constipation.      . DULoxetine (CYMBALTA) 60 MG capsule Take 60 mg by mouth daily.      Marland Kitchen FIORICET 50-300-40 MG CAPS 1 to 2 every 4-6 hrs as needed  60 capsule  0  . fish oil-omega-3 fatty acids 1000 MG capsule Take 2 g by mouth 2 (two) times daily.      . Flavocoxid (LIMBREL) 500 MG CAPS Take by mouth  2 (two) times daily.      Marland Kitchen guaiFENesin (MUCINEX) 600 MG 12 hr tablet Take 1,200 mg by mouth 2 (two) times daily as needed for congestion.      . Multiple Vitamin (MULTIVITAMIN WITH MINERALS) TABS Take 1 tablet by mouth daily.      . nortriptyline (PAMELOR) 10 MG capsule Take 20 mg by mouth at bedtime.      . ondansetron (ZOFRAN) 4 MG tablet       . oxyCODONE-acetaminophen (PERCOCET) 7.5-325 MG per tablet Take 1 tablet by mouth every 4 (four) hours as needed for pain (1-3 tabs prn daily).       . pregabalin (LYRICA) 50 MG capsule Take 50 mg by mouth 4 (four) times daily.       . pregabalin (LYRICA) 75 MG capsule Take 75 mg by mouth 3 (three) times daily.      . Red Yeast Rice 600 MG CAPS Take 1,200 mg by mouth 2 (two) times daily.      Marland Kitchen tiZANidine (ZANAFLEX) 2 MG tablet Take 6 mg by mouth 3 (three) times daily.      . TURMERIC PO Take by mouth 3 (three) times daily.       No current facility-administered medications for this visit.  Past Medical History  Diagnosis Date  . Palpitations   . SVT (supraventricular tachycardia)     very brief runs  . Migraine   . Tobacco abuse   . COPD (chronic obstructive pulmonary disease)   . Cervical spondylosis without myelopathy   . Myalgia and myositis, unspecified   . Lumbosacral spondylosis without myelopathy     Past Surgical History  Procedure Laterality Date  . Wisdom tooth extraction      Family History  Problem Relation Age of Onset  . Hyperlipidemia Mother   . Hypertension Mother   . Hyperlipidemia Father   . Hypertension Father   . Diabetes Father   . Hypertension Brother   . Hyperlipidemia Brother     History   Social History  . Marital Status: Divorced    Spouse Name: N/A    Number of Children: N/A  . Years of Education: N/A   Occupational History  . Not on file.   Social History Main Topics  . Smoking status: Current Every Day Smoker -- 1.50 packs/day    Types: Cigarettes  . Smokeless tobacco: Never Used  .  Alcohol Use: No  . Drug Use: No  . Sexual Activity: Not on file   Other Topics Concern  . Not on file   Social History Narrative  . No narrative on file    Review of systems: The patient specifically denies any chest pain at rest or with exertion, dyspnea at rest or with exertion, orthopnea, paroxysmal nocturnal dyspnea, syncope, palpitations, focal neurological deficits, intermittent claudication, lower extremity edema, unexplained weight gain, cough, hemoptysis or wheezing.  The patient also denies abdominal pain, nausea, vomiting, dysphagia, diarrhea, constipation, polyuria, polydipsia, dysuria, hematuria, frequency, urgency, abnormal bleeding or bruising, fever, chills, unexpected weight changes, mood swings, change in skin or hair texture, change in voice quality, auditory or visual problems, allergic reactions or rashes, new musculoskeletal complaints other than usual "aches and pains".   PHYSICAL EXAM BP 122/78  Pulse 69  Resp 16  Ht '5\' 7"'  (1.702 m)  Wt 166 lb 6.4 oz (75.479 kg)  BMI 26.06 kg/m2  General: Alert, oriented x3, no distress Head: no evidence of trauma, PERRL, EOMI, no exophtalmos or lid lag, no myxedema, no xanthelasma; normal ears, nose and oropharynx Neck: normal jugular venous pulsations and no hepatojugular reflux; brisk carotid pulses without delay and no carotid bruits Chest: clear to auscultation, no signs of consolidation by percussion or palpation, normal fremitus, symmetrical and full respiratory excursions Cardiovascular: normal position and quality of the apical impulse, regular rhythm, normal first and second heart sounds, no murmurs, rubs or gallops Abdomen: no tenderness or distention, no masses by palpation, no abnormal pulsatility or arterial bruits, normal bowel sounds, no hepatosplenomegaly Extremities: no clubbing, cyanosis or edema; 2+ radial, ulnar and brachial pulses bilaterally; 2+ right femoral, posterior tibial and dorsalis pedis pulses; 2+  left femoral, posterior tibial and dorsalis pedis pulses; no subclavian or femoral bruits Neurological: grossly nonfocal   EKG: Normal sinus rhythm, minor nonspecific ST segment abnormality, no changes, QTC 383 ms  Lipid Panel     Component Value Date/Time   CHOL 182 04/09/2007   TRIG 68 04/09/2007   HDL 51 04/09/2007   LDLCALC 117 04/09/2007    BMET    Component Value Date/Time   NA 136 10/08/2010 0005   K 3.6 10/08/2010 0005   CL 105 10/08/2010 0005   CO2 26 10/08/2010 0005   GLUCOSE 102* 10/08/2010 0005   BUN 6 10/08/2010  0005   CREATININE 0.68 10/08/2010 0005   CALCIUM 8.5 10/08/2010 0005   GFRNONAA >60 10/08/2010 0005   GFRAA  Value: >60        The eGFR has been calculated using the MDRD equation. This calculation has not been validated in all clinical situations. eGFR's persistently <60 mL/min signify possible Chronic Kidney Disease. 10/08/2010 0005     ASSESSMENT AND PLAN PSVT Rare palpitations, presumably related to recurrent paroxysmal atrial tachycardia. She is minimally symptomatic and more intense/specific therapy does not appear indicated.  Family history of premature CAD I encouraged her strongly to completely quit cigarettes. She should make sure that she doesn't use the electronic cigarettes long-term, but rather as a bridge to complete cessation. She is expresses some true interest and I think will try to quit.  Lipid disorder Although her lipid levels are not optimal, specifically her LDL is a little higher than desirable, no additional pharmacological therapy is indicated at this time; healthy diet and exercise were discussed   Orders Placed This Encounter  Procedures  . EKG 12-Lead   Meds ordered this encounter  Medications  . DULoxetine (CYMBALTA) 60 MG capsule    Sig: Take 60 mg by mouth daily.  Marland Kitchen tiZANidine (ZANAFLEX) 2 MG tablet    Sig: Take 6 mg by mouth 3 (three) times daily.  . pregabalin (LYRICA) 75 MG capsule    Sig: Take 75 mg by mouth 3 (three) times  daily.  . diclofenac (CATAFLAM) 50 MG tablet    Sig: Take 50 mg by mouth 2 (two) times daily.  . nortriptyline (PAMELOR) 10 MG capsule    Sig: Take 20 mg by mouth at bedtime.    Lynnley Doddridge  Sanda Klein, MD, [facility] [phone] office [phone] pager

## 2014-04-08 NOTE — Assessment & Plan Note (Signed)
I encouraged her strongly to completely quit cigarettes. She should make sure that she doesn't use the electronic cigarettes long-term, but rather as a bridge to complete cessation. She is expresses some true interest and I think will try to quit.

## 2014-04-08 NOTE — Assessment & Plan Note (Signed)
Although her lipid levels are not optimal, specifically her LDL is a little higher than desirable, no additional pharmacological therapy is indicated at this time; healthy diet and exercise were discussed

## 2014-04-08 NOTE — Assessment & Plan Note (Signed)
Rare palpitations, presumably related to recurrent paroxysmal atrial tachycardia. She is minimally symptomatic and more intense/specific therapy does not appear indicated.

## 2014-11-15 ENCOUNTER — Encounter (HOSPITAL_COMMUNITY): Payer: Self-pay | Admitting: Emergency Medicine

## 2014-11-15 ENCOUNTER — Emergency Department (HOSPITAL_COMMUNITY)
Admission: EM | Admit: 2014-11-15 | Discharge: 2014-11-15 | Discharge status: Against Medical Advice | Payer: Medicare Other | Attending: Emergency Medicine | Admitting: Emergency Medicine

## 2014-11-15 DIAGNOSIS — G43909 Migraine, unspecified, not intractable, without status migrainosus: Secondary | ICD-10-CM | POA: Diagnosis not present

## 2014-11-15 DIAGNOSIS — Z720 Tobacco use: Secondary | ICD-10-CM | POA: Insufficient documentation

## 2014-11-15 DIAGNOSIS — J449 Chronic obstructive pulmonary disease, unspecified: Secondary | ICD-10-CM | POA: Insufficient documentation

## 2014-11-15 NOTE — ED Notes (Signed)
Pt stated she was not going to wait any longer

## 2014-11-15 NOTE — ED Notes (Signed)
Bed: WA27 Expected date:  Expected time:  Means of arrival:  Comments: 

## 2014-11-15 NOTE — ED Notes (Signed)
Pt arrived to the ED with a complaint of a migraine.  Pt states the migraine for 15 days.  Pt states she has seen the neurologist and is on medication but she gets these continually every 15 days.  Pt states's he usually gets a shot and is sent home.  Pt is suppose to see neurologist in the am.

## 2014-12-09 DIAGNOSIS — M47812 Spondylosis without myelopathy or radiculopathy, cervical region: Secondary | ICD-10-CM | POA: Diagnosis not present

## 2014-12-09 DIAGNOSIS — M47816 Spondylosis without myelopathy or radiculopathy, lumbar region: Secondary | ICD-10-CM | POA: Diagnosis not present

## 2014-12-09 DIAGNOSIS — M961 Postlaminectomy syndrome, not elsewhere classified: Secondary | ICD-10-CM | POA: Diagnosis not present

## 2014-12-09 DIAGNOSIS — G894 Chronic pain syndrome: Secondary | ICD-10-CM | POA: Diagnosis not present

## 2014-12-14 DIAGNOSIS — R200 Anesthesia of skin: Secondary | ICD-10-CM | POA: Diagnosis not present

## 2014-12-14 DIAGNOSIS — G479 Sleep disorder, unspecified: Secondary | ICD-10-CM | POA: Diagnosis not present

## 2014-12-14 DIAGNOSIS — R51 Headache: Secondary | ICD-10-CM | POA: Diagnosis not present

## 2014-12-14 DIAGNOSIS — M4802 Spinal stenosis, cervical region: Secondary | ICD-10-CM | POA: Diagnosis not present

## 2014-12-15 DIAGNOSIS — M2041 Other hammer toe(s) (acquired), right foot: Secondary | ICD-10-CM | POA: Diagnosis not present

## 2014-12-15 DIAGNOSIS — L03031 Cellulitis of right toe: Secondary | ICD-10-CM | POA: Diagnosis not present

## 2014-12-30 DIAGNOSIS — L03031 Cellulitis of right toe: Secondary | ICD-10-CM | POA: Diagnosis not present

## 2015-01-05 DIAGNOSIS — M47817 Spondylosis without myelopathy or radiculopathy, lumbosacral region: Secondary | ICD-10-CM | POA: Diagnosis not present

## 2015-01-06 DIAGNOSIS — M47816 Spondylosis without myelopathy or radiculopathy, lumbar region: Secondary | ICD-10-CM | POA: Diagnosis not present

## 2015-01-06 DIAGNOSIS — M961 Postlaminectomy syndrome, not elsewhere classified: Secondary | ICD-10-CM | POA: Diagnosis not present

## 2015-01-06 DIAGNOSIS — M47812 Spondylosis without myelopathy or radiculopathy, cervical region: Secondary | ICD-10-CM | POA: Diagnosis not present

## 2015-01-06 DIAGNOSIS — G894 Chronic pain syndrome: Secondary | ICD-10-CM | POA: Diagnosis not present

## 2015-01-07 ENCOUNTER — Other Ambulatory Visit (HOSPITAL_COMMUNITY): Payer: Self-pay | Admitting: Cardiovascular Disease

## 2015-01-07 DIAGNOSIS — I6529 Occlusion and stenosis of unspecified carotid artery: Secondary | ICD-10-CM

## 2015-01-12 DIAGNOSIS — G43909 Migraine, unspecified, not intractable, without status migrainosus: Secondary | ICD-10-CM | POA: Diagnosis not present

## 2015-01-12 DIAGNOSIS — K219 Gastro-esophageal reflux disease without esophagitis: Secondary | ICD-10-CM | POA: Diagnosis not present

## 2015-02-02 ENCOUNTER — Encounter (HOSPITAL_COMMUNITY): Payer: Self-pay

## 2015-02-04 DIAGNOSIS — G894 Chronic pain syndrome: Secondary | ICD-10-CM | POA: Diagnosis not present

## 2015-02-04 DIAGNOSIS — M47816 Spondylosis without myelopathy or radiculopathy, lumbar region: Secondary | ICD-10-CM | POA: Diagnosis not present

## 2015-02-04 DIAGNOSIS — M47812 Spondylosis without myelopathy or radiculopathy, cervical region: Secondary | ICD-10-CM | POA: Diagnosis not present

## 2015-02-04 DIAGNOSIS — M961 Postlaminectomy syndrome, not elsewhere classified: Secondary | ICD-10-CM | POA: Diagnosis not present

## 2015-02-22 DIAGNOSIS — M1811 Unilateral primary osteoarthritis of first carpometacarpal joint, right hand: Secondary | ICD-10-CM | POA: Diagnosis not present

## 2015-02-22 DIAGNOSIS — M1812 Unilateral primary osteoarthritis of first carpometacarpal joint, left hand: Secondary | ICD-10-CM | POA: Diagnosis not present

## 2015-02-23 ENCOUNTER — Ambulatory Visit (HOSPITAL_COMMUNITY)
Admission: RE | Admit: 2015-02-23 | Discharge: 2015-02-23 | Disposition: A | Discharge status: Home / Self Care | Payer: Medicare Other | Source: Ambulatory Visit | Attending: Cardiovascular Disease | Admitting: Cardiovascular Disease

## 2015-02-23 DIAGNOSIS — I6529 Occlusion and stenosis of unspecified carotid artery: Secondary | ICD-10-CM | POA: Diagnosis not present

## 2015-02-23 NOTE — Progress Notes (Signed)
Carotid Duplex Completed. °[name],RVT °

## 2015-03-05 DIAGNOSIS — M961 Postlaminectomy syndrome, not elsewhere classified: Secondary | ICD-10-CM | POA: Diagnosis not present

## 2015-03-05 DIAGNOSIS — M47816 Spondylosis without myelopathy or radiculopathy, lumbar region: Secondary | ICD-10-CM | POA: Diagnosis not present

## 2015-03-05 DIAGNOSIS — M47812 Spondylosis without myelopathy or radiculopathy, cervical region: Secondary | ICD-10-CM | POA: Diagnosis not present

## 2015-03-05 DIAGNOSIS — G894 Chronic pain syndrome: Secondary | ICD-10-CM | POA: Diagnosis not present

## 2015-03-08 DIAGNOSIS — M940 Chondrocostal junction syndrome [Tietze]: Secondary | ICD-10-CM | POA: Diagnosis not present

## 2015-03-08 DIAGNOSIS — R072 Precordial pain: Secondary | ICD-10-CM | POA: Diagnosis not present

## 2015-03-17 DIAGNOSIS — M47816 Spondylosis without myelopathy or radiculopathy, lumbar region: Secondary | ICD-10-CM | POA: Diagnosis not present

## 2015-03-17 DIAGNOSIS — M47812 Spondylosis without myelopathy or radiculopathy, cervical region: Secondary | ICD-10-CM | POA: Diagnosis not present

## 2015-03-17 DIAGNOSIS — M961 Postlaminectomy syndrome, not elsewhere classified: Secondary | ICD-10-CM | POA: Diagnosis not present

## 2015-03-17 DIAGNOSIS — G894 Chronic pain syndrome: Secondary | ICD-10-CM | POA: Diagnosis not present

## 2015-03-18 ENCOUNTER — Other Ambulatory Visit: Payer: Self-pay | Admitting: Obstetrics and Gynecology

## 2015-03-18 DIAGNOSIS — Z01419 Encounter for gynecological examination (general) (routine) without abnormal findings: Secondary | ICD-10-CM | POA: Diagnosis not present

## 2015-03-18 DIAGNOSIS — Z124 Encounter for screening for malignant neoplasm of cervix: Secondary | ICD-10-CM | POA: Diagnosis not present

## 2015-03-19 LAB — CYTOLOGY - PAP

## 2015-05-12 DIAGNOSIS — M47816 Spondylosis without myelopathy or radiculopathy, lumbar region: Secondary | ICD-10-CM | POA: Diagnosis not present

## 2015-05-12 DIAGNOSIS — M961 Postlaminectomy syndrome, not elsewhere classified: Secondary | ICD-10-CM | POA: Diagnosis not present

## 2015-05-12 DIAGNOSIS — M47812 Spondylosis without myelopathy or radiculopathy, cervical region: Secondary | ICD-10-CM | POA: Diagnosis not present

## 2015-05-12 DIAGNOSIS — G894 Chronic pain syndrome: Secondary | ICD-10-CM | POA: Diagnosis not present

## 2015-05-25 DIAGNOSIS — R079 Chest pain, unspecified: Secondary | ICD-10-CM | POA: Diagnosis not present

## 2015-05-25 DIAGNOSIS — R110 Nausea: Secondary | ICD-10-CM | POA: Diagnosis not present

## 2015-05-26 DIAGNOSIS — H18822 Corneal disorder due to contact lens, left eye: Secondary | ICD-10-CM | POA: Diagnosis not present

## 2015-06-10 DIAGNOSIS — H5213 Myopia, bilateral: Secondary | ICD-10-CM | POA: Diagnosis not present

## 2015-06-10 DIAGNOSIS — H524 Presbyopia: Secondary | ICD-10-CM | POA: Diagnosis not present

## 2015-06-10 DIAGNOSIS — H18822 Corneal disorder due to contact lens, left eye: Secondary | ICD-10-CM | POA: Diagnosis not present

## 2015-07-07 DIAGNOSIS — Z86010 Personal history of colonic polyps: Secondary | ICD-10-CM | POA: Diagnosis not present

## 2015-07-07 DIAGNOSIS — Z09 Encounter for follow-up examination after completed treatment for conditions other than malignant neoplasm: Secondary | ICD-10-CM | POA: Diagnosis not present

## 2015-07-08 DIAGNOSIS — Z79891 Long term (current) use of opiate analgesic: Secondary | ICD-10-CM | POA: Diagnosis not present

## 2015-07-08 DIAGNOSIS — K219 Gastro-esophageal reflux disease without esophagitis: Secondary | ICD-10-CM | POA: Diagnosis not present

## 2015-07-08 DIAGNOSIS — M47816 Spondylosis without myelopathy or radiculopathy, lumbar region: Secondary | ICD-10-CM | POA: Diagnosis not present

## 2015-07-08 DIAGNOSIS — M47812 Spondylosis without myelopathy or radiculopathy, cervical region: Secondary | ICD-10-CM | POA: Diagnosis not present

## 2015-07-08 DIAGNOSIS — G894 Chronic pain syndrome: Secondary | ICD-10-CM | POA: Diagnosis not present

## 2015-07-08 DIAGNOSIS — Z0001 Encounter for general adult medical examination with abnormal findings: Secondary | ICD-10-CM | POA: Diagnosis not present

## 2015-07-08 DIAGNOSIS — M961 Postlaminectomy syndrome, not elsewhere classified: Secondary | ICD-10-CM | POA: Diagnosis not present

## 2015-07-08 DIAGNOSIS — E559 Vitamin D deficiency, unspecified: Secondary | ICD-10-CM | POA: Diagnosis not present

## 2015-07-27 DIAGNOSIS — Z0000 Encounter for general adult medical examination without abnormal findings: Secondary | ICD-10-CM | POA: Diagnosis not present

## 2015-07-27 DIAGNOSIS — F17200 Nicotine dependence, unspecified, uncomplicated: Secondary | ICD-10-CM | POA: Diagnosis not present

## 2015-08-06 DIAGNOSIS — M47816 Spondylosis without myelopathy or radiculopathy, lumbar region: Secondary | ICD-10-CM | POA: Diagnosis not present

## 2015-08-06 DIAGNOSIS — M47812 Spondylosis without myelopathy or radiculopathy, cervical region: Secondary | ICD-10-CM | POA: Diagnosis not present

## 2015-08-06 DIAGNOSIS — M961 Postlaminectomy syndrome, not elsewhere classified: Secondary | ICD-10-CM | POA: Diagnosis not present

## 2015-08-06 DIAGNOSIS — G894 Chronic pain syndrome: Secondary | ICD-10-CM | POA: Diagnosis not present

## 2015-09-03 DIAGNOSIS — M47812 Spondylosis without myelopathy or radiculopathy, cervical region: Secondary | ICD-10-CM | POA: Diagnosis not present

## 2015-09-03 DIAGNOSIS — M961 Postlaminectomy syndrome, not elsewhere classified: Secondary | ICD-10-CM | POA: Diagnosis not present

## 2015-09-03 DIAGNOSIS — M47816 Spondylosis without myelopathy or radiculopathy, lumbar region: Secondary | ICD-10-CM | POA: Diagnosis not present

## 2015-09-03 DIAGNOSIS — G894 Chronic pain syndrome: Secondary | ICD-10-CM | POA: Diagnosis not present

## 2015-09-15 DIAGNOSIS — J209 Acute bronchitis, unspecified: Secondary | ICD-10-CM | POA: Diagnosis not present

## 2015-09-30 DIAGNOSIS — J209 Acute bronchitis, unspecified: Secondary | ICD-10-CM | POA: Diagnosis not present

## 2015-10-01 DIAGNOSIS — M47812 Spondylosis without myelopathy or radiculopathy, cervical region: Secondary | ICD-10-CM | POA: Diagnosis not present

## 2015-10-01 DIAGNOSIS — M47816 Spondylosis without myelopathy or radiculopathy, lumbar region: Secondary | ICD-10-CM | POA: Diagnosis not present

## 2015-10-01 DIAGNOSIS — G894 Chronic pain syndrome: Secondary | ICD-10-CM | POA: Diagnosis not present

## 2015-10-01 DIAGNOSIS — M961 Postlaminectomy syndrome, not elsewhere classified: Secondary | ICD-10-CM | POA: Diagnosis not present

## 2015-10-05 DIAGNOSIS — R509 Fever, unspecified: Secondary | ICD-10-CM | POA: Diagnosis not present

## 2015-10-05 DIAGNOSIS — I471 Supraventricular tachycardia: Secondary | ICD-10-CM | POA: Diagnosis not present

## 2015-10-05 DIAGNOSIS — N390 Urinary tract infection, site not specified: Secondary | ICD-10-CM | POA: Diagnosis not present

## 2015-10-05 DIAGNOSIS — R05 Cough: Secondary | ICD-10-CM | POA: Diagnosis not present

## 2015-10-07 ENCOUNTER — Telehealth: Payer: Self-pay | Admitting: Internal Medicine

## 2015-10-07 NOTE — Telephone Encounter (Signed)
Pt  is looking for a new provider but would like to meet with dr Damita Dunnings before making a new patient appointment.  She understand dr Josefine Class new patient appointment are into next year. Pt has [facility]

## 2015-10-07 NOTE — Telephone Encounter (Signed)
She can make a regular new patient appointment if she desires.  She can elect to continue care with me after that appointment.

## 2015-10-11 DIAGNOSIS — R05 Cough: Secondary | ICD-10-CM | POA: Diagnosis not present

## 2015-10-11 DIAGNOSIS — R509 Fever, unspecified: Secondary | ICD-10-CM | POA: Diagnosis not present

## 2015-10-13 NOTE — Telephone Encounter (Signed)
Pt aware.

## 2015-10-27 DIAGNOSIS — M47816 Spondylosis without myelopathy or radiculopathy, lumbar region: Secondary | ICD-10-CM | POA: Diagnosis not present

## 2015-10-27 DIAGNOSIS — G894 Chronic pain syndrome: Secondary | ICD-10-CM | POA: Diagnosis not present

## 2015-10-27 DIAGNOSIS — M47812 Spondylosis without myelopathy or radiculopathy, cervical region: Secondary | ICD-10-CM | POA: Diagnosis not present

## 2015-10-27 DIAGNOSIS — M961 Postlaminectomy syndrome, not elsewhere classified: Secondary | ICD-10-CM | POA: Diagnosis not present

## 2015-11-24 DIAGNOSIS — M47816 Spondylosis without myelopathy or radiculopathy, lumbar region: Secondary | ICD-10-CM | POA: Diagnosis not present

## 2015-11-24 DIAGNOSIS — M47812 Spondylosis without myelopathy or radiculopathy, cervical region: Secondary | ICD-10-CM | POA: Diagnosis not present

## 2015-11-24 DIAGNOSIS — M961 Postlaminectomy syndrome, not elsewhere classified: Secondary | ICD-10-CM | POA: Diagnosis not present

## 2015-11-24 DIAGNOSIS — G894 Chronic pain syndrome: Secondary | ICD-10-CM | POA: Diagnosis not present

## 2015-12-08 DIAGNOSIS — R5383 Other fatigue: Secondary | ICD-10-CM | POA: Diagnosis not present

## 2015-12-08 DIAGNOSIS — R109 Unspecified abdominal pain: Secondary | ICD-10-CM | POA: Diagnosis not present

## 2015-12-22 DIAGNOSIS — M47816 Spondylosis without myelopathy or radiculopathy, lumbar region: Secondary | ICD-10-CM | POA: Diagnosis not present

## 2015-12-22 DIAGNOSIS — G894 Chronic pain syndrome: Secondary | ICD-10-CM | POA: Diagnosis not present

## 2015-12-22 DIAGNOSIS — M47812 Spondylosis without myelopathy or radiculopathy, cervical region: Secondary | ICD-10-CM | POA: Diagnosis not present

## 2015-12-22 DIAGNOSIS — M961 Postlaminectomy syndrome, not elsewhere classified: Secondary | ICD-10-CM | POA: Diagnosis not present

## 2016-01-01 DIAGNOSIS — S61250A Open bite of right index finger without damage to nail, initial encounter: Secondary | ICD-10-CM | POA: Diagnosis not present

## 2016-01-03 DIAGNOSIS — S61250A Open bite of right index finger without damage to nail, initial encounter: Secondary | ICD-10-CM | POA: Diagnosis not present

## 2016-01-05 DIAGNOSIS — S61250A Open bite of right index finger without damage to nail, initial encounter: Secondary | ICD-10-CM | POA: Diagnosis not present

## 2016-01-19 DIAGNOSIS — M47816 Spondylosis without myelopathy or radiculopathy, lumbar region: Secondary | ICD-10-CM | POA: Diagnosis not present

## 2016-01-19 DIAGNOSIS — M47812 Spondylosis without myelopathy or radiculopathy, cervical region: Secondary | ICD-10-CM | POA: Diagnosis not present

## 2016-01-19 DIAGNOSIS — M961 Postlaminectomy syndrome, not elsewhere classified: Secondary | ICD-10-CM | POA: Diagnosis not present

## 2016-01-19 DIAGNOSIS — G894 Chronic pain syndrome: Secondary | ICD-10-CM | POA: Diagnosis not present

## 2016-02-15 DIAGNOSIS — M47812 Spondylosis without myelopathy or radiculopathy, cervical region: Secondary | ICD-10-CM | POA: Diagnosis not present

## 2016-02-15 DIAGNOSIS — M47816 Spondylosis without myelopathy or radiculopathy, lumbar region: Secondary | ICD-10-CM | POA: Diagnosis not present

## 2016-02-15 DIAGNOSIS — G894 Chronic pain syndrome: Secondary | ICD-10-CM | POA: Diagnosis not present

## 2016-02-15 DIAGNOSIS — M961 Postlaminectomy syndrome, not elsewhere classified: Secondary | ICD-10-CM | POA: Diagnosis not present

## 2016-02-25 ENCOUNTER — Emergency Department
Admission: EM | Admit: 2016-02-25 | Discharge: 2016-02-25 | Disposition: A | Discharge status: Home / Self Care | Payer: Medicare Other | Attending: Emergency Medicine | Admitting: Emergency Medicine

## 2016-02-25 ENCOUNTER — Encounter: Payer: Self-pay | Admitting: Emergency Medicine

## 2016-02-25 ENCOUNTER — Emergency Department: Payer: Medicare Other

## 2016-02-25 DIAGNOSIS — Z791 Long term (current) use of non-steroidal anti-inflammatories (NSAID): Secondary | ICD-10-CM | POA: Insufficient documentation

## 2016-02-25 DIAGNOSIS — W01198A Fall on same level from slipping, tripping and stumbling with subsequent striking against other object, initial encounter: Secondary | ICD-10-CM | POA: Insufficient documentation

## 2016-02-25 DIAGNOSIS — R51 Headache: Secondary | ICD-10-CM | POA: Diagnosis not present

## 2016-02-25 DIAGNOSIS — S0990XA Unspecified injury of head, initial encounter: Secondary | ICD-10-CM | POA: Insufficient documentation

## 2016-02-25 DIAGNOSIS — F17210 Nicotine dependence, cigarettes, uncomplicated: Secondary | ICD-10-CM | POA: Diagnosis not present

## 2016-02-25 DIAGNOSIS — Y998 Other external cause status: Secondary | ICD-10-CM | POA: Insufficient documentation

## 2016-02-25 DIAGNOSIS — Y9289 Other specified places as the place of occurrence of the external cause: Secondary | ICD-10-CM | POA: Diagnosis not present

## 2016-02-25 DIAGNOSIS — Y9389 Activity, other specified: Secondary | ICD-10-CM | POA: Insufficient documentation

## 2016-02-25 DIAGNOSIS — W19XXXA Unspecified fall, initial encounter: Secondary | ICD-10-CM

## 2016-02-25 DIAGNOSIS — M549 Dorsalgia, unspecified: Secondary | ICD-10-CM | POA: Insufficient documentation

## 2016-02-25 DIAGNOSIS — G8929 Other chronic pain: Secondary | ICD-10-CM | POA: Diagnosis not present

## 2016-02-25 DIAGNOSIS — Z79899 Other long term (current) drug therapy: Secondary | ICD-10-CM | POA: Diagnosis not present

## 2016-02-25 NOTE — Discharge Instructions (Signed)
Return to the emergency room if you feel chest pain or shortness of breath, lightheadedness, severe headache, especially headache is different from normal headache,  or you have any other new or worrisome symptoms. Follow closely with her doctor.

## 2016-02-25 NOTE — ED Notes (Signed)
Patient states that she was through her house this morning and fell. Patient says that she is unsure of why she fell, denies tripping over anything. Patient is also c/o headache, she has a hx/o migraines but states that this is different than her normal migraines. Patient states that her scalp feels "sore".  Patient ambulated to room without difficulty. Patient up in room ambulating currently.

## 2016-02-25 NOTE — ED Provider Notes (Signed)
[facility] Emergency Department Provider Note  ____________________________________________   I have reviewed the triage vital signs and the nursing notes.   HISTORY  Chief Complaint Fall    HPI [name] is a 60 y.o. female who states that she fell this morning and bumped her head. Initially, she stated that she did not trip ORS she describes the incident is clear that she lost her footing and stumbled and fell. She did not have any syncopal symptoms. She denied chest pain shows breath nausea vomiting. She did not feel lightheaded. I'm, she feels completely back to baseline. Patient states she has a migraine 16 days of the month and she is having a migraine prior to coming in but then she took a nap in the room and her headache is completely gone at this time. She states that she does have some discomfort where she bumped her head. She did not pass out. She members following. She did take her Percocet prior to coming in and that seems to help resolve her symptoms. She is a chronic back pain. She denies any other injury.  Past Medical History  Diagnosis Date  . Palpitations   . SVT (supraventricular tachycardia) ([place])     very brief runs  . Migraine   . Tobacco abuse   . COPD (chronic obstructive pulmonary disease) ([place])   . Cervical spondylosis without myelopathy   . Myalgia and myositis, unspecified   . Lumbosacral spondylosis without myelopathy     Patient Active Problem List   Diagnosis Date Noted  . Family history of premature CAD 04/08/2014  . Lipid disorder 04/08/2014  . Myalgia and myositis, unspecified 04/18/2013  . Cervical spondylosis without myelopathy 04/18/2013  . CHEST WALL PAIN, ACUTE 10/22/2007  . BRONCHITIS NOS 08/23/2007  . DEPRESSION 08/10/2007  . SYNDROME, CHRONIC PAIN 08/10/2007  . MIGRAINE HEADACHE 08/10/2007  . PSVT 08/10/2007  . CONSTIPATION 08/10/2007  . DEGENERATIVE DISC DISEASE, CERVICAL SPINE 08/10/2007  .  Dizziness and giddiness 08/10/2007  . CARPAL TUNNEL SYNDROME, BILATERAL 04/09/2007  . MAMMOGRAM, ABNORMAL, LEFT 02/27/2007  . ABNORMAL PAP SMEAR, LGSIL 02/24/2004    Past Surgical History  Procedure Laterality Date  . Wisdom tooth extraction      Current Outpatient Rx  Name  Route  Sig  Dispense  Refill  . Ascorbic Acid (VITAMIN C) 1000 MG tablet   Oral   Take 1,000 mg by mouth 2 (two) times daily.         . Calcium Carbonate-Vit D-Min (CALCIUM 1200 PO)   Oral   Take 1,200 mg by mouth 2 (two) times daily.         Marland Kitchen dexlansoprazole (DEXILANT) 60 MG capsule   Oral   Take 60 mg by mouth daily.         . diclofenac (CATAFLAM) 50 MG tablet   Oral   Take 50 mg by mouth 2 (two) times daily.         Marland Kitchen docusate sodium (COLACE) 100 MG capsule   Oral   Take 100 mg by mouth 2 (two) times daily as needed for constipation.         . DULoxetine (CYMBALTA) 60 MG capsule   Oral   Take 60 mg by mouth daily.         Marland Kitchen FIORICET 50-300-40 MG CAPS      1 to 2 every 4-6 hrs as needed   60 capsule   0     Dispense as written.   Marland Kitchen  fish oil-omega-3 fatty acids 1000 MG capsule   Oral   Take 2 g by mouth 2 (two) times daily.         . Flavocoxid (LIMBREL) 500 MG CAPS   Oral   Take by mouth 2 (two) times daily.         Marland Kitchen guaiFENesin (MUCINEX) 600 MG 12 hr tablet   Oral   Take 1,200 mg by mouth 2 (two) times daily as needed for congestion.         . Multiple Vitamin (MULTIVITAMIN WITH MINERALS) TABS   Oral   Take 1 tablet by mouth daily.         . nortriptyline (PAMELOR) 10 MG capsule   Oral   Take 20 mg by mouth at bedtime.         . ondansetron (ZOFRAN) 4 MG tablet               . oxyCODONE-acetaminophen (PERCOCET) 7.5-325 MG per tablet   Oral   Take 1 tablet by mouth every 4 (four) hours as needed for pain (1-3 tabs prn daily).          . pregabalin (LYRICA) 50 MG capsule   Oral   Take 50 mg by mouth 4 (four) times daily.          .  pregabalin (LYRICA) 75 MG capsule   Oral   Take 75 mg by mouth 3 (three) times daily.         . Red Yeast Rice 600 MG CAPS   Oral   Take 1,200 mg by mouth 2 (two) times daily.         Marland Kitchen tiZANidine (ZANAFLEX) 2 MG tablet   Oral   Take 6 mg by mouth 3 (three) times daily.         . TURMERIC PO   Oral   Take by mouth 3 (three) times daily.           Allergies Carafate; Cefaclor; Nitrofurantoin; and Wellbutrin  Family History  Problem Relation Age of Onset  . Hyperlipidemia Mother   . Hypertension Mother   . Hyperlipidemia Father   . Hypertension Father   . Diabetes Father   . Hypertension Brother   . Hyperlipidemia Brother     Social History Social History  Substance Use Topics  . Smoking status: Current Every Day Smoker -- 1.50 packs/day    Types: Cigarettes  . Smokeless tobacco: Never Used  . Alcohol Use: No    Review of Systems Constitutional: No fever/chills Eyes: No visual changes. ENT: No sore throat. No stiff neck no neck pain Cardiovascular: Denies chest pain. Respiratory: Denies shortness of breath. Gastrointestinal:   no vomiting.  No diarrhea.  No constipation. Genitourinary: Negative for dysuria. Musculoskeletal: Negative lower extremity swelling Skin: Negative for rash. Neurological: Negative for headaches, focal weakness or numbness. 10-point ROS otherwise negative.  ____________________________________________   PHYSICAL EXAM:  VITAL SIGNS: ED Triage Vitals  Enc Vitals Group     BP 02/25/16 0959 132/79 mmHg     Pulse Rate 02/25/16 0959 83     Resp 02/25/16 0959 18     Temp 02/25/16 0959 98.3 F (36.8 C)     Temp Source 02/25/16 0959 Oral     SpO2 02/25/16 0959 98 %     Weight 02/25/16 0959 160 lb (72.576 kg)     Height 02/25/16 0959 '5\' 7"'$  (1.702 m)     Head Cir --  Peak Flow --      Pain Score --      Pain Loc --      Pain Edu? --      Excl. in [place]? --     Constitutional: Alert and oriented. Well appearing and in no  acute distress. Eyes: Conjunctivae are normal. PERRL. EOMI. Head: Atraumatic. Nose: No congestion/rhinnorhea. Mouth/Throat: Mucous membranes are moist.  Oropharynx non-erythematous. Neck: No stridor.   Nontender with no meningismus Cardiovascular: Normal rate, regular rhythm. Grossly normal heart sounds.  Good peripheral circulation. Respiratory: Normal respiratory effort.  No retractions. Lungs CTAB. Abdominal: Soft and nontender. No distention. No guarding no rebound Back:  There is no focal tenderness or step off there is no midline tenderness there are no lesions noted. there is no CVA tenderness Musculoskeletal: No lower extremity tenderness. No joint effusions, no DVT signs strong distal pulses no edema Neurologic: Cranial nerves II through XII are grossly intact 5 out of 5 strength bilateral upper and lower extremity. Finger to nose within normal limits heel to shin within normal limits, speech is normal with no word finding difficulty or dysarthria, reflexes symmetric, pupils are equally round and reactive to light, there is no pronator drift, sensation is normal, vision is intact to confrontation, gait is deferred, there is no nystagmus, normal neurologic exam Skin:  Skin is warm, dry and intact. No rash noted. Psychiatric: Mood and affect are normal. Speech and behavior are normal.  ____________________________________________   LABS (all labs ordered are listed, but only abnormal results are displayed)  Labs Reviewed - No data to display ____________________________________________  EKG  I personally interpreted any EKGs ordered by me or triage  ____________________________________________  RADIOLOGY  I reviewed any imaging ordered by me or triage that were performed during my shift and, if possible, patient and/or family made aware of any abnormal findings. ____________________________________________   PROCEDURES  Procedure(s) performed: None  Critical Care  performed: None  ____________________________________________   INITIAL IMPRESSION / ASSESSMENT AND PLAN / ED COURSE  Pertinent labs & imaging results that were available during my care of the patient were reviewed by me and considered in my medical decision making (see chart for details).  Patient in no acute distress with no evidence of syncope, she now remembers tripping and falling. She did hit her head, CT is negative. Not on any blood thinners. No evidence of neurologic discussion. No complaint of chest pain shows breath nausea or vomiting. No syncopal symptoms. She is eager to go home and she is requesting discharge. She has already taken her own pain medication. I have advised her not to drive on the Percocet that she has taken. Cautions and follow-up given and understood ____________________________________________   FINAL CLINICAL IMPRESSION(S) / ED DIAGNOSES  Final diagnoses:  None      This chart was dictated using voice recognition software.  Despite best efforts to proofread,  errors can occur which can change meaning.     Schuyler Amor, MD 02/25/16 [phone]

## 2016-02-25 NOTE — ED Notes (Signed)
States she tripped and fell and hit head on a tv stand.  Reports ha.  PERRL, MAE, grips equal.

## 2016-02-28 DIAGNOSIS — I471 Supraventricular tachycardia: Secondary | ICD-10-CM | POA: Diagnosis not present

## 2016-02-28 DIAGNOSIS — Z09 Encounter for follow-up examination after completed treatment for conditions other than malignant neoplasm: Secondary | ICD-10-CM | POA: Diagnosis not present

## 2016-02-28 DIAGNOSIS — G43909 Migraine, unspecified, not intractable, without status migrainosus: Secondary | ICD-10-CM | POA: Diagnosis not present

## 2016-02-28 DIAGNOSIS — W19XXXA Unspecified fall, initial encounter: Secondary | ICD-10-CM | POA: Diagnosis not present

## 2016-03-14 ENCOUNTER — Other Ambulatory Visit: Payer: Self-pay | Admitting: Physical Medicine and Rehabilitation

## 2016-03-14 ENCOUNTER — Ambulatory Visit
Admission: RE | Admit: 2016-03-14 | Discharge: 2016-03-14 | Disposition: A | Discharge status: Home / Self Care | Payer: Medicare Other | Source: Ambulatory Visit | Attending: Physical Medicine and Rehabilitation | Admitting: Physical Medicine and Rehabilitation

## 2016-03-14 DIAGNOSIS — M47816 Spondylosis without myelopathy or radiculopathy, lumbar region: Secondary | ICD-10-CM | POA: Diagnosis not present

## 2016-03-14 DIAGNOSIS — G894 Chronic pain syndrome: Secondary | ICD-10-CM | POA: Diagnosis not present

## 2016-03-14 DIAGNOSIS — M542 Cervicalgia: Secondary | ICD-10-CM

## 2016-03-14 DIAGNOSIS — M47812 Spondylosis without myelopathy or radiculopathy, cervical region: Secondary | ICD-10-CM | POA: Diagnosis not present

## 2016-03-14 DIAGNOSIS — M961 Postlaminectomy syndrome, not elsewhere classified: Secondary | ICD-10-CM | POA: Diagnosis not present

## 2016-04-11 DIAGNOSIS — M961 Postlaminectomy syndrome, not elsewhere classified: Secondary | ICD-10-CM | POA: Diagnosis not present

## 2016-04-11 DIAGNOSIS — G894 Chronic pain syndrome: Secondary | ICD-10-CM | POA: Diagnosis not present

## 2016-04-11 DIAGNOSIS — M47816 Spondylosis without myelopathy or radiculopathy, lumbar region: Secondary | ICD-10-CM | POA: Diagnosis not present

## 2016-04-11 DIAGNOSIS — M47812 Spondylosis without myelopathy or radiculopathy, cervical region: Secondary | ICD-10-CM | POA: Diagnosis not present

## 2016-05-09 DIAGNOSIS — M47816 Spondylosis without myelopathy or radiculopathy, lumbar region: Secondary | ICD-10-CM | POA: Diagnosis not present

## 2016-05-09 DIAGNOSIS — M961 Postlaminectomy syndrome, not elsewhere classified: Secondary | ICD-10-CM | POA: Diagnosis not present

## 2016-05-09 DIAGNOSIS — G894 Chronic pain syndrome: Secondary | ICD-10-CM | POA: Diagnosis not present

## 2016-05-09 DIAGNOSIS — M47812 Spondylosis without myelopathy or radiculopathy, cervical region: Secondary | ICD-10-CM | POA: Diagnosis not present

## 2016-05-16 DIAGNOSIS — R0789 Other chest pain: Secondary | ICD-10-CM | POA: Diagnosis not present

## 2016-05-16 DIAGNOSIS — R079 Chest pain, unspecified: Secondary | ICD-10-CM | POA: Diagnosis not present

## 2016-06-01 DIAGNOSIS — J189 Pneumonia, unspecified organism: Secondary | ICD-10-CM | POA: Diagnosis not present

## 2016-06-20 DIAGNOSIS — S30861A Insect bite (nonvenomous) of abdominal wall, initial encounter: Secondary | ICD-10-CM | POA: Diagnosis not present

## 2016-06-22 ENCOUNTER — Institutional Professional Consult (permissible substitution): Payer: Self-pay | Admitting: Pulmonary Disease

## 2016-07-06 DIAGNOSIS — G894 Chronic pain syndrome: Secondary | ICD-10-CM | POA: Diagnosis not present

## 2016-07-06 DIAGNOSIS — M47812 Spondylosis without myelopathy or radiculopathy, cervical region: Secondary | ICD-10-CM | POA: Diagnosis not present

## 2016-07-06 DIAGNOSIS — M47816 Spondylosis without myelopathy or radiculopathy, lumbar region: Secondary | ICD-10-CM | POA: Diagnosis not present

## 2016-07-06 DIAGNOSIS — M961 Postlaminectomy syndrome, not elsewhere classified: Secondary | ICD-10-CM | POA: Diagnosis not present

## 2016-07-20 DIAGNOSIS — R0789 Other chest pain: Secondary | ICD-10-CM | POA: Diagnosis not present

## 2016-07-20 DIAGNOSIS — Z0000 Encounter for general adult medical examination without abnormal findings: Secondary | ICD-10-CM | POA: Diagnosis not present

## 2016-07-20 DIAGNOSIS — E789 Disorder of lipoprotein metabolism, unspecified: Secondary | ICD-10-CM | POA: Diagnosis not present

## 2016-07-20 DIAGNOSIS — Z79899 Other long term (current) drug therapy: Secondary | ICD-10-CM | POA: Diagnosis not present

## 2016-07-20 DIAGNOSIS — K219 Gastro-esophageal reflux disease without esophagitis: Secondary | ICD-10-CM | POA: Diagnosis not present

## 2016-07-27 DIAGNOSIS — Z8701 Personal history of pneumonia (recurrent): Secondary | ICD-10-CM | POA: Diagnosis not present

## 2016-07-27 DIAGNOSIS — Z0389 Encounter for observation for other suspected diseases and conditions ruled out: Secondary | ICD-10-CM | POA: Diagnosis not present

## 2016-07-27 DIAGNOSIS — Z0000 Encounter for general adult medical examination without abnormal findings: Secondary | ICD-10-CM | POA: Diagnosis not present

## 2016-07-27 DIAGNOSIS — K219 Gastro-esophageal reflux disease without esophagitis: Secondary | ICD-10-CM | POA: Diagnosis not present

## 2016-08-03 DIAGNOSIS — Z803 Family history of malignant neoplasm of breast: Secondary | ICD-10-CM | POA: Diagnosis not present

## 2016-08-03 DIAGNOSIS — M961 Postlaminectomy syndrome, not elsewhere classified: Secondary | ICD-10-CM | POA: Diagnosis not present

## 2016-08-03 DIAGNOSIS — G894 Chronic pain syndrome: Secondary | ICD-10-CM | POA: Diagnosis not present

## 2016-08-03 DIAGNOSIS — Z79891 Long term (current) use of opiate analgesic: Secondary | ICD-10-CM | POA: Diagnosis not present

## 2016-08-03 DIAGNOSIS — M47816 Spondylosis without myelopathy or radiculopathy, lumbar region: Secondary | ICD-10-CM | POA: Diagnosis not present

## 2016-08-03 DIAGNOSIS — Z1231 Encounter for screening mammogram for malignant neoplasm of breast: Secondary | ICD-10-CM | POA: Diagnosis not present

## 2016-08-03 DIAGNOSIS — M47812 Spondylosis without myelopathy or radiculopathy, cervical region: Secondary | ICD-10-CM | POA: Diagnosis not present

## 2016-08-08 DIAGNOSIS — T148 Other injury of unspecified body region: Secondary | ICD-10-CM | POA: Diagnosis not present

## 2016-08-08 DIAGNOSIS — R0789 Other chest pain: Secondary | ICD-10-CM | POA: Diagnosis not present

## 2016-08-10 DIAGNOSIS — R922 Inconclusive mammogram: Secondary | ICD-10-CM | POA: Diagnosis not present

## 2016-08-10 DIAGNOSIS — R928 Other abnormal and inconclusive findings on diagnostic imaging of breast: Secondary | ICD-10-CM | POA: Diagnosis not present

## 2016-08-28 ENCOUNTER — Ambulatory Visit (INDEPENDENT_AMBULATORY_CARE_PROVIDER_SITE_OTHER): Payer: Medicare Other | Admitting: Pulmonary Disease

## 2016-08-28 ENCOUNTER — Encounter: Payer: Self-pay | Admitting: Pulmonary Disease

## 2016-08-28 VITALS — BP 104/66 | HR 102 | Ht 67.0 in | Wt 162.4 lb

## 2016-08-28 DIAGNOSIS — R0600 Dyspnea, unspecified: Secondary | ICD-10-CM | POA: Diagnosis not present

## 2016-08-28 DIAGNOSIS — J449 Chronic obstructive pulmonary disease, unspecified: Secondary | ICD-10-CM

## 2016-08-28 DIAGNOSIS — R079 Chest pain, unspecified: Secondary | ICD-10-CM

## 2016-08-28 DIAGNOSIS — F17200 Nicotine dependence, unspecified, uncomplicated: Secondary | ICD-10-CM | POA: Insufficient documentation

## 2016-08-28 MED ORDER — TIOTROPIUM BROMIDE-OLODATEROL 2.5-2.5 MCG/ACT IN AERS: 2.0000 | INHALATION_SPRAY | Freq: Every day | RESPIRATORY_TRACT | 0 refills | Status: DC

## 2016-08-28 NOTE — Progress Notes (Signed)
Subjective:    Patient ID: [name], female    DOB: [birth year], 58 y.o.   MRN: [id number]  HPI Chief Complaint  Patient presents with  . Advice Only    C/o CP x 8 weeks. refer for abnormal CXR    [name] is here to see me today for a possible abnormal CXR and chest pain.  She has had costchondritis and in April and May of this year she struggled with this more.  She describes this ongoing chest pain as well.  She notes dyspnea and chest pain.    Chest pain: > she is still having this, but it has been a problem for months > the pain changes frequently.  She will have dull substernal pain, sometimes it radiates to her neck, sometimes it radiates to her left breast. > the pain has been present since 03/2015, was worse this year in May > the pain is worse with stress > movements make no difference in her chest pain > it is relatively constant > the majority of the pain is substernal, she has been told to take ibuprofen for it > she took Ibuprofen '800mg'$  tid for two weeks which didn't make the pain better > she did not apply heat to the pain which didn't help > the pain hasn't improved since April  Dyspnea > comes and goes > can occur randomly at rest > exertion will make it short of breath > bending over will make her short of breath > When she is short or breath she feels like she is breathing with shallow breathing  She has a cough which she attributes to throat irritation from allergies.  When she was a child she was in an oxygen tent but she has never been told while.  She has smoked over the years.  She used to smoke 1.5 ppd started age 24.  She is smoking about 1 pack of cigarettes per month at this point.  She has been vaping 2.5 years with nicotine containing solution, she typically goes through several ounces per month but she doesn't know or keep track of how much she uses.    She had a tick bite in 05/2016 and was treated with doxycycline.  She said that  during this time she had abdominal pain which made her stomach upset.  She had a diagnosis of pneumonia around June 2017 and she had a repeat CXR in 07/2016 and had a repeat CXR.     Past Medical History:  Diagnosis Date  . ADD (attention deficit disorder)   . Carotid stenosis   . Cervical spondylosis without myelopathy   . COPD (chronic obstructive pulmonary disease) ([place])   . Costochondritis   . Fibromyalgia   . GERD (gastroesophageal reflux disease)   . Lumbosacral spondylosis without myelopathy   . Migraine   . Mitral valve prolapse   . MRSA (methicillin resistant staph aureus) culture positive   . Myalgia and myositis, unspecified   . Palpitations   . Paroxysmal supraventricular tachycardia ([place])   . SVT (supraventricular tachycardia) ([place])    very brief runs  . Tobacco abuse      Family History  Problem Relation Age of Onset  . Hyperlipidemia Mother   . Hypertension Mother   . Hyperlipidemia Father   . Hypertension Father   . Diabetes Father   . Hypertension Brother   . Hyperlipidemia Brother      Social History   Social History  . Marital status: Divorced  Spouse name: N/A  . Number of children: N/A  . Years of education: N/A   Occupational History  . Not on file.   Social History Main Topics  . Smoking status: Current Every Day Smoker    Packs/day: 1.50    Types: Cigarettes  . Smokeless tobacco: Never Used     Comment: uses vape as well.   . Alcohol use No  . Drug use: No  . Sexual activity: Not on file   Other Topics Concern  . Not on file   Social History Narrative  . No narrative on file     Allergies  Allergen Reactions  . Carafate [Sucralfate]   . Cefaclor     REACTION: Hives and swelling  . Depakote [Divalproex Sodium]   . Nitrofurantoin     REACTION: Hives and swelling  . Sulfamethoxazole     Other reaction(s): Unknown  . Wellbutrin [Bupropion]      Outpatient Medications Prior to Visit  Medication Sig Dispense Refill  .  Ascorbic Acid (VITAMIN C) 1000 MG tablet Take 1,000 mg by mouth 2 (two) times daily.    . Calcium Carbonate-Vit D-Min (CALCIUM 1200 PO) Take 1,200 mg by mouth 2 (two) times daily.    Marland Kitchen dexlansoprazole (DEXILANT) 60 MG capsule Take 60 mg by mouth daily.    Marland Kitchen docusate sodium (COLACE) 100 MG capsule Take 100 mg by mouth 2 (two) times daily as needed for constipation.    Marland Kitchen FIORICET 50-300-40 MG CAPS 1 to 2 every 4-6 hrs as needed 60 capsule 0  . Multiple Vitamin (MULTIVITAMIN WITH MINERALS) TABS Take 1 tablet by mouth daily.    Marland Kitchen oxyCODONE-acetaminophen (PERCOCET) 7.5-325 MG per tablet Take 1 tablet by mouth every 4 (four) hours as needed for pain (1-3 tabs prn daily).     . TURMERIC PO Take by mouth 3 (three) times daily.    . pregabalin (LYRICA) 50 MG capsule Take 50 mg by mouth 4 (four) times daily.     . diclofenac (CATAFLAM) 50 MG tablet Take 50 mg by mouth 2 (two) times daily.    . DULoxetine (CYMBALTA) 60 MG capsule Take 60 mg by mouth daily.    . fish oil-omega-3 fatty acids 1000 MG capsule Take 2 g by mouth 2 (two) times daily.    . Flavocoxid (LIMBREL) 500 MG CAPS Take by mouth 2 (two) times daily.    Marland Kitchen guaiFENesin (MUCINEX) 600 MG 12 hr tablet Take 1,200 mg by mouth 2 (two) times daily as needed for congestion.    . nortriptyline (PAMELOR) 10 MG capsule Take 20 mg by mouth at bedtime.    . ondansetron (ZOFRAN) 4 MG tablet     . pregabalin (LYRICA) 75 MG capsule Take 75 mg by mouth 3 (three) times daily.    . Red Yeast Rice 600 MG CAPS Take 1,200 mg by mouth 2 (two) times daily.    Marland Kitchen tiZANidine (ZANAFLEX) 2 MG tablet Take 6 mg by mouth 3 (three) times daily.     No facility-administered medications prior to visit.       Review of Systems  Constitutional: Negative for chills, fever and unexpected weight change.  HENT: Negative for congestion, dental problem, ear pain, nosebleeds, postnasal drip, rhinorrhea, sinus pressure, sneezing, sore throat, trouble swallowing and voice change.     Eyes: Negative for visual disturbance.  Respiratory: Negative for cough, choking and shortness of breath.   Cardiovascular: Negative for chest pain and leg swelling.  Gastrointestinal: Negative for  abdominal pain, diarrhea and vomiting.  Genitourinary: Negative for difficulty urinating.  Musculoskeletal: Negative for arthralgias.  Skin: Negative for rash.  Neurological: Negative for tremors, syncope and headaches.  Hematological: Does not bruise/bleed easily.       Objective:   Physical Exam Vitals:   08/28/16 1523  BP: 104/66  Pulse: (!) 102  SpO2: 100%  Weight: 162 lb 6.4 oz (73.7 kg)  Height: '5\' 7"'$  (1.702 m)   RA  Gen: well appearing, no acute distress HENT: NCAT, OP clear, neck supple without masses Eyes: PERRL, EOMi Lymph: no cervical lymphadenopathy PULM: CTA B CV: RRR, no mgr, no JVD GI: BS+, soft, nontender, no hsm Derm: no rash or skin breakdown MSK: normal bulk and tone Neuro: A&Ox4, CN II-XII intact, strength 5/5 in all 4 extremities Psyche: normal mood and affect   August 2017 chest x-ray images personally reviewed showing perhaps mild hyperinflation, but no obvious pulmonary parenchymal abnormality  Records from outside pulmonary group reviewed were she was seen for pneumonia, June 2017 chest x-ray showed possible infiltrate left lower lobe possible small left pleural effusion August 2017 CBC within normal limits January 2017 TSH low at 0.15     Assessment & Plan:  Dyspnea She has dyspnea which has been progressive for the last year in the setting of smoking for years. She's been evaluated for cardiac disease in the past and was told that her heart was okay. Objectively her lungs are clear to auscultation and her most recent chest x-ray is normal but is somewhat suggestive of hyperinflation. She is at increased risk of COPD so we will test for that today.  Plan: Simple spirometry today Ambulatory oximetry today  Chest pain She describes over 19 months  of dyspnea associated with substernal chest pain. She seen multiple providers for this. She's been told that its costochondritis but her symptoms of been persistent despite standard treatment for that with nonsteroidal anti-inflammatory medications. Objectively her cardiac and pulmonary exams are within normal limits.  I explained to her today that the differential diagnosis of chest pain is exceedingly broad and includes esophageal disease, cardiac disease, mediastinal disease, pulmonary disease, musculoskeletal disease, neurologic disease among others. The list is so long that I feel our role is to focus on pulmonary causes. As a lifelong smoker she is exceedingly high risk for lung cancer.  Plan: CT scan of the chest to assess for lung cancer or other mediastinal cause of pain If CT scan is normal then I recommend she go back to her primary care physician and consider a cardiology evaluation.  COPD mixed type ([place]) Today's simple spirometry test showed COPD, moderate based on airflow criteria.  Given her persistent symptoms with exacerbations in the last year she is a GOLD C.  Plan Start Stiolto 2 puffs daily, sample given F/u one week  Tobacco use disorder Counseled at length to quit smoking today.    Current Outpatient Prescriptions:  .  Ascorbic Acid (VITAMIN C) 1000 MG tablet, Take 1,000 mg by mouth 2 (two) times daily., Disp: , Rfl:  .  aspirin EC 81 MG tablet, Take 81 mg by mouth daily., Disp: , Rfl:  .  Calcium Carbonate-Vit D-Min (CALCIUM 1200 PO), Take 1,200 mg by mouth 2 (two) times daily., Disp: , Rfl:  .  cetirizine (ZYRTEC) 10 MG tablet, Take 10 mg by mouth daily as needed for allergies., Disp: , Rfl:  .  dexlansoprazole (DEXILANT) 60 MG capsule, Take 60 mg by mouth daily., Disp: , Rfl:  .  diphenhydrAMINE (BENADRYL) 25 MG tablet, Take 25 mg by mouth every 6 (six) hours as needed., Disp: , Rfl:  .  docusate sodium (COLACE) 100 MG capsule, Take 100 mg by mouth 2 (two) times  daily as needed for constipation., Disp: , Rfl:  .  FIORICET 50-300-40 MG CAPS, 1 to 2 every 4-6 hrs as needed, Disp: 60 capsule, Rfl: 0 .  fluticasone (FLONASE) 50 MCG/ACT nasal spray, Place 2 sprays into both nostrils daily., Disp: , Rfl:  .  Ginger, Zingiber officinalis, (GINGER ROOT) 550 MG CAPS, Take 1-2 capsules by mouth 3 times/day as needed-between meals & bedtime., Disp: , Rfl:  .  loratadine (CLARITIN) 10 MG tablet, Take 10 mg by mouth daily as needed for allergies., Disp: , Rfl:  .  medroxyPROGESTERone (PROVERA) 5 MG tablet, Take 2.5-5 mg by mouth daily., Disp: , Rfl:  .  methocarbamol (ROBAXIN) 750 MG tablet, Take 750 mg by mouth 4 (four) times daily., Disp: , Rfl:  .  Multiple Vitamin (MULTIVITAMIN WITH MINERALS) TABS, Take 1 tablet by mouth daily., Disp: , Rfl:  .  oxyCODONE-acetaminophen (PERCOCET) 7.5-325 MG per tablet, Take 1 tablet by mouth every 4 (four) hours as needed for pain (1-3 tabs prn daily). , Disp: , Rfl:  .  POLYETHYLENE GLYCOL 3350 PO, Take by mouth daily., Disp: , Rfl:  .  pregabalin (LYRICA) 150 MG capsule, Take 150 mg by mouth 2 (two) times daily., Disp: , Rfl:  .  pyridoxine (B-6) 100 MG tablet, Take 100 mg by mouth 2 (two) times daily., Disp: , Rfl:  .  ranitidine (ZANTAC) 150 MG tablet, Take 150 mg by mouth 3 times/day as needed-between meals & bedtime for heartburn., Disp: , Rfl:  .  TURMERIC PO, Take by mouth 3 (three) times daily., Disp: , Rfl:  .  venlafaxine (EFFEXOR) 75 MG tablet, Take 75 mg by mouth 3 (three) times daily with meals., Disp: , Rfl:  .  zolpidem (AMBIEN) 10 MG tablet, Take 10 mg by mouth at bedtime as needed for sleep., Disp: , Rfl:  .  Tiotropium Bromide-Olodaterol (STIOLTO RESPIMAT) 2.5-2.5 MCG/ACT AERS, Inhale 2 puffs into the lungs daily., Disp: 1 Inhaler, Rfl: 0

## 2016-08-28 NOTE — Assessment & Plan Note (Signed)
Counseled at length to quit smoking today. 

## 2016-08-28 NOTE — Assessment & Plan Note (Signed)
She describes over 19 months of dyspnea associated with substernal chest pain. She seen multiple providers for this. She's been told that its costochondritis but her symptoms of been persistent despite standard treatment for that with nonsteroidal anti-inflammatory medications. Objectively her cardiac and pulmonary exams are within normal limits.  I explained to her today that the differential diagnosis of chest pain is exceedingly broad and includes esophageal disease, cardiac disease, mediastinal disease, pulmonary disease, musculoskeletal disease, neurologic disease among others. The list is so long that I feel our role is to focus on pulmonary causes. As a lifelong smoker she is exceedingly high risk for lung cancer.  Plan: CT scan of the chest to assess for lung cancer or other mediastinal cause of pain If CT scan is normal then I recommend she go back to her primary care physician and consider a cardiology evaluation.

## 2016-08-28 NOTE — Assessment & Plan Note (Signed)
Today's simple spirometry test showed COPD, moderate based on airflow criteria.  Given her persistent symptoms with exacerbations in the last year she is a GOLD C.  Plan Start Stiolto 2 puffs daily, sample given F/u one week

## 2016-08-28 NOTE — Patient Instructions (Addendum)
Take to Stiolto 2 puffs daily We will arrange for a CT scan of your chest and then go over the results with you in clinic in about a week Stop smoking We'll see you back in one week with one of our nurse practitioners

## 2016-08-28 NOTE — Assessment & Plan Note (Signed)
She has dyspnea which has been progressive for the last year in the setting of smoking for years. She's been evaluated for cardiac disease in the past and was told that her heart was okay. Objectively her lungs are clear to auscultation and her most recent chest x-ray is normal but is somewhat suggestive of hyperinflation. She is at increased risk of COPD so we will test for that today.  Plan: Simple spirometry today Ambulatory oximetry today

## 2016-08-30 ENCOUNTER — Ambulatory Visit: Payer: Medicare Other

## 2016-08-31 DIAGNOSIS — G894 Chronic pain syndrome: Secondary | ICD-10-CM | POA: Diagnosis not present

## 2016-08-31 DIAGNOSIS — M47812 Spondylosis without myelopathy or radiculopathy, cervical region: Secondary | ICD-10-CM | POA: Diagnosis not present

## 2016-08-31 DIAGNOSIS — M961 Postlaminectomy syndrome, not elsewhere classified: Secondary | ICD-10-CM | POA: Diagnosis not present

## 2016-08-31 DIAGNOSIS — M47816 Spondylosis without myelopathy or radiculopathy, lumbar region: Secondary | ICD-10-CM | POA: Diagnosis not present

## 2016-09-04 ENCOUNTER — Ambulatory Visit
Admission: RE | Admit: 2016-09-04 | Discharge: 2016-09-04 | Disposition: A | Discharge status: Home / Self Care | Payer: Medicare Other | Source: Ambulatory Visit | Attending: Pulmonary Disease | Admitting: Pulmonary Disease

## 2016-09-04 ENCOUNTER — Ambulatory Visit: Payer: Self-pay | Admitting: Adult Health

## 2016-09-04 ENCOUNTER — Telehealth: Payer: Self-pay | Admitting: Pulmonary Disease

## 2016-09-04 DIAGNOSIS — R918 Other nonspecific abnormal finding of lung field: Secondary | ICD-10-CM | POA: Diagnosis not present

## 2016-09-04 DIAGNOSIS — Z720 Tobacco use: Secondary | ICD-10-CM | POA: Diagnosis not present

## 2016-09-04 DIAGNOSIS — R9389 Abnormal findings on diagnostic imaging of other specified body structures: Secondary | ICD-10-CM

## 2016-09-04 DIAGNOSIS — R079 Chest pain, unspecified: Secondary | ICD-10-CM | POA: Diagnosis not present

## 2016-09-04 NOTE — Telephone Encounter (Signed)
Pt returning call.[name] ° °

## 2016-09-04 NOTE — Telephone Encounter (Signed)
Please call the patient and let her know that her CT scan does show a spot in her left upper lung that we need to assess with a PET CT scan. She may need a biopsy to determine if this is scar tissue or cancer but we will know more after she has the PET CT scan. Thanks.

## 2016-09-04 NOTE — Telephone Encounter (Signed)
Received a call report from [facility]. Pt had a CT done today.  IMPRESSION: Spiculated mass lesion in the left upper lobe as described above consistent with primary pulmonary neoplasm. Few scattered AP window nodes are seen. PET-CT is recommended for further evaluation.  JN - please advise as BQ is on vacation. Thanks.

## 2016-09-04 NOTE — Telephone Encounter (Signed)
[id number] [id number] 

## 2016-09-04 NOTE — Telephone Encounter (Signed)
Spoke with pt and notified of results per Dr. Ashok Cordia. Pt verbalized understanding and denied any questions. Order sent to [facility]

## 2016-09-08 ENCOUNTER — Ambulatory Visit: Payer: Self-pay | Admitting: Adult Health

## 2016-09-08 ENCOUNTER — Ambulatory Visit
Admission: RE | Admit: 2016-09-08 | Discharge: 2016-09-08 | Disposition: A | Discharge status: Home / Self Care | Payer: Medicare Other | Source: Ambulatory Visit | Attending: Pulmonary Disease | Admitting: Pulmonary Disease

## 2016-09-08 DIAGNOSIS — I6523 Occlusion and stenosis of bilateral carotid arteries: Secondary | ICD-10-CM | POA: Insufficient documentation

## 2016-09-08 DIAGNOSIS — I517 Cardiomegaly: Secondary | ICD-10-CM | POA: Insufficient documentation

## 2016-09-08 DIAGNOSIS — R911 Solitary pulmonary nodule: Secondary | ICD-10-CM | POA: Diagnosis not present

## 2016-09-08 DIAGNOSIS — J432 Centrilobular emphysema: Secondary | ICD-10-CM | POA: Diagnosis not present

## 2016-09-08 DIAGNOSIS — I2510 Atherosclerotic heart disease of native coronary artery without angina pectoris: Secondary | ICD-10-CM | POA: Insufficient documentation

## 2016-09-08 DIAGNOSIS — Z975 Presence of (intrauterine) contraceptive device: Secondary | ICD-10-CM | POA: Diagnosis not present

## 2016-09-08 DIAGNOSIS — R590 Localized enlarged lymph nodes: Secondary | ICD-10-CM | POA: Diagnosis not present

## 2016-09-08 DIAGNOSIS — Z9049 Acquired absence of other specified parts of digestive tract: Secondary | ICD-10-CM | POA: Diagnosis not present

## 2016-09-08 DIAGNOSIS — I700 Atherosclerosis of aorta: Secondary | ICD-10-CM | POA: Insufficient documentation

## 2016-09-08 DIAGNOSIS — R9389 Abnormal findings on diagnostic imaging of other specified body structures: Secondary | ICD-10-CM

## 2016-09-08 DIAGNOSIS — R938 Abnormal findings on diagnostic imaging of other specified body structures: Secondary | ICD-10-CM | POA: Diagnosis present

## 2016-09-08 LAB — GLUCOSE, CAPILLARY: GLUCOSE-CAPILLARY: 74 mg/dL (ref 65–99)

## 2016-09-08 MED ORDER — FLUDEOXYGLUCOSE F - 18 (FDG) INJECTION
12.4200 | Freq: Once | INTRAVENOUS | Status: AC | PRN
  Administered 2016-09-08: 12.42 via INTRAVENOUS

## 2016-09-11 ENCOUNTER — Telehealth: Payer: Self-pay | Admitting: Pulmonary Disease

## 2016-09-11 ENCOUNTER — Encounter: Payer: Self-pay | Admitting: Pulmonary Disease

## 2016-09-11 DIAGNOSIS — C3410 Malignant neoplasm of upper lobe, unspecified bronchus or lung: Secondary | ICD-10-CM

## 2016-09-11 DIAGNOSIS — R911 Solitary pulmonary nodule: Secondary | ICD-10-CM

## 2016-09-11 HISTORY — DX: Malignant neoplasm of upper lobe, unspecified bronchus or lung: C34.10

## 2016-09-11 NOTE — Telephone Encounter (Signed)
I called [name] to discuss the results of the CT chest.  I want her to have a needle aspiration of her cervical lymphadenopathy by interventional radiology.  Also, because of the appearance of the nodule and lack of clear mediastinal lymphadenopathy I would like for her to have a thoracic surgery evaluation for lobectomy possibly with EBUS.

## 2016-09-12 NOTE — Telephone Encounter (Signed)
Orders placed.  Pt aware of BQ's recs as BQ spoke with pt directly.  Nothing further needed.

## 2016-09-12 NOTE — Addendum Note (Signed)
Addended by: Len Blalock on: 09/12/2016 05:26 PM   Modules accepted: Orders

## 2016-09-15 NOTE — Addendum Note (Signed)
Addended by: Len Blalock on: 09/15/2016 04:53 PM   Modules accepted: Orders

## 2016-09-18 ENCOUNTER — Encounter: Payer: Self-pay | Admitting: Cardiothoracic Surgery

## 2016-09-18 ENCOUNTER — Institutional Professional Consult (permissible substitution) (INDEPENDENT_AMBULATORY_CARE_PROVIDER_SITE_OTHER): Payer: Medicare Other | Admitting: Cardiothoracic Surgery

## 2016-09-18 ENCOUNTER — Telehealth: Payer: Self-pay | Admitting: Pulmonary Disease

## 2016-09-18 VITALS — BP 131/87 | HR 108 | Resp 16 | Ht 67.0 in | Wt 162.0 lb

## 2016-09-18 DIAGNOSIS — R911 Solitary pulmonary nodule: Secondary | ICD-10-CM

## 2016-09-18 DIAGNOSIS — D491 Neoplasm of unspecified behavior of respiratory system: Secondary | ICD-10-CM

## 2016-09-18 NOTE — Telephone Encounter (Signed)
Spoke with [name] at [facility] scheduling - needing order to be placed differently.  Pt is to have a biopsy done per telephone note - order that is in the computer is for a CT biopsy and this needs to be a U/S Guided Bx per Radiology. I checked with [facility] and she showed me a "staff message" conversation that her and Dr [place] Bells held where he states that he wants a U/S Guided Bx - order has been changed and per [name] at Radiology she states this is now correct. Order cancelled for CT Biopsy. Nothing further needed.

## 2016-09-18 NOTE — Patient Instructions (Addendum)
Pulmonary Nodule A pulmonary nodule is a small, round growth of tissue in the lung. Pulmonary nodules can range in size from less than 1/5 inch (4 mm) to a little bigger than an inch (25 mm). Most pulmonary nodules are detected when imaging tests of the lung are being performed for a different problem. Pulmonary nodules are usually not cancerous (benign). However, some pulmonary nodules are cancerous (malignant). Follow-up treatment or testing is based on the size of the pulmonary nodule and your risk of getting lung cancer.  CAUSES Benign pulmonary nodules can be caused by various things. Some of the causes include:   Bacterial, fungal, or viral infections. This is usually an old infection that is no longer active, but it can sometimes be a current, active infection.  A benign mass of tissue.  Inflammation from conditions such as rheumatoid arthritis.   Abnormal blood vessels in the lungs. Malignant pulmonary nodules can result from lung cancer or from cancers that spread to the lung from other places in the body. SIGNS AND SYMPTOMS Pulmonary nodules usually do not cause symptoms. DIAGNOSIS Most often, pulmonary nodules are found incidentally when an X-ray or CT scan is performed to look for some other problem in the lung area. To help determine whether a pulmonary nodule is benign or malignant, your health care provider will take a medical history and order a variety of tests. Tests done may include:   Blood tests.  A skin test called a tuberculin test. This test is used to determine if you have been exposed to the germ that causes tuberculosis.   Chest X-rays. If possible, a new X-ray may be compared with X-rays you have had in the past.   CT scan. This test shows smaller pulmonary nodules more clearly than an X-ray.   Positron emission tomography (PET) scan. In this test, a safe amount of a radioactive substance is injected into the bloodstream. Then, the scan takes a picture of  the pulmonary nodule. The radioactive substance is eliminated from your body in your urine.   Biopsy. A tiny piece of the pulmonary nodule is removed so it can be checked under a microscope. TREATMENT  Pulmonary nodules that are benign normally do not require any treatment because they usually do not cause symptoms or breathing problems. Your health care provider may want to monitor the pulmonary nodule through follow-up CT scans. The frequency of these CT scans will vary based on the size of the nodule and the risk factors for lung cancer. For example, CT scans will need to be done more frequently if the pulmonary nodule is larger and if you have a history of smoking and a family history of cancer. Further testing or biopsies may be done if any follow-up CT scan shows that the size of the pulmonary nodule has increased. HOME CARE INSTRUCTIONS  Only take over-the-counter or prescription medicines as directed by your health care provider.  Keep all follow-up appointments with your health care provider. SEEK MEDICAL CARE IF:  You have trouble breathing when you are active.   You feel sick or unusually tired.   You do not feel like eating.   You lose weight without trying to.   You develop chills or night sweats.  SEEK IMMEDIATE MEDICAL CARE IF:  You cannot catch your breath, or you begin wheezing.   You cannot stop coughing.   You cough up blood.   You become dizzy or feel like you are going to pass out.   You   have sudden chest pain.   You have a fever or persistent symptoms for more than 2-3 days.   You have a fever and your symptoms suddenly get worse. MAKE SURE YOU:  Understand these instructions.  Will watch your condition.  Will get help right away if you are not doing well or get worse.   This information is not intended to replace advice given to you by your health care provider. Make sure you discuss any questions you have with your health care  provider.   Document Released: 09/17/2009 Document Revised: 07/23/2013 Document Reviewed: 05/12/2013 Elsevier Interactive Patient Education 2016 [place] Lung cancer occurs when abnormal cells in the lung grow out of control and form a mass (tumor). There are several types of lung cancer. The two most common types are:  Non-small cell. In this type of lung cancer, abnormal cells are larger and grow more slowly than those of small cell lung cancer.  Small cell. In this type of lung cancer, abnormal cells are smaller than those of non-small cell lung cancer. Small cell lung cancer gets worse faster than non-small cell lung cancer. CAUSES  The leading cause of lung cancer is smoking tobacco. The second leading cause is radon exposure. RISK FACTORS  Smoking tobacco.  Exposure to secondhand tobacco smoke.  Exposure to radon gas.  Exposure to asbestos.  Exposure to arsenic in drinking water.  Air pollution.  Family or personal history of lung cancer.  Lung radiation therapy.  Being older than 24 years. SIGNS AND SYMPTOMS  In the early stages, symptoms may not be present. As the cancer progresses, symptoms may include:  A lasting cough, possibly with blood.  Fatigue.  Unexplained weight loss.  Shortness of breath.  Wheezing.  Chest pain.  Loss of appetite. Symptoms of advanced lung cancer include:  Hoarseness.  Bone or joint pain.  Weakness.  Nail problems.  Face or arm swelling.  Paralysis of the face.  Drooping eyelids. DIAGNOSIS  Lung cancer can be identified with a physical exam and with tests such as:  A chest X-ray.  A CT scan.  Blood tests.  A biopsy. After a diagnosis is made, you will have more tests to determine the stage of the cancer. The stages of non-small cell lung cancer are:  Stage 0, also called carcinoma in situ. At this stage, abnormal cells are found in the inner lining of your lung or lungs.  Stage I. At  this stage, abnormal cells have grown into a tumor that is no larger than 5 cm across. The cancer has entered the deeper lung tissue but has not yet entered the lymph nodes or other parts of the body.  Stage II. At this stage, the tumor is 7 cm across or smaller and has entered nearby lymph nodes. Or, the tumor is 5 cm across or smaller and has invaded surrounding tissue but is not found in nearby lymph nodes. There may be more than one tumor present.  Stage III. At this stage, the tumor may be any size. There may be more than one tumor in the lungs. The cancer cells have spread to the lymph nodes and possibly to other organs.  Stage IV. At this stage, there are tumors in both lungs and the cancer has spread to other areas of the body. The stages of small cell lung cancer are:  Limited. At this stage, the cancer is found only on one side of the chest.  Extensive. At  this stage, the cancer is in the lungs and in tissues on the other side of the chest. The cancer has spread to other organs or is found in the fluid between the layers of your lungs. TREATMENT  Depending on the type and stage of your lung cancer, you may be treated with:  Surgery. This is done to remove a tumor.  Radiation therapy. This treatment destroys cancer cells using X-rays or other types of radiation.  Chemotherapy. This treatment uses medicines to destroy cancer cells.  Targeted therapy. This treatment aims to destroy only cancer cells instead of all cells as other therapies do. You may also have a combination of treatments. HOME CARE INSTRUCTIONS   Do not use any tobacco products. This includes cigarettes, chewing tobacco, and electronic cigarettes. If you need help quitting, ask your health care provider.  Take medicines only as directed by your health care provider.  Eat a healthy diet. Work with a dietitian to make sure you are getting the nutrition you need.  Consider joining a support group or seeking  counseling to help you cope with the stress of having lung cancer.  Let your cancer specialist (oncologist) know if you are admitted to the hospital.  Keep all follow-up visits as directed by your health care provider. This is important. SEEK MEDICAL CARE IF:   You lose weight without trying.  You have a persistent cough and wheezing.  You feel short of breath.  You tire easily.  You experience bone or joint pain.  You have difficulty swallowing.  You feel hoarse or notice your voice changing.  Your pain medicine is not helping. SEEK IMMEDIATE MEDICAL CARE IF:   You cough up blood.  You have new breathing problems.  You develop chest pain.  You develop swelling in:  One or both ankles or legs.  Your face, neck, or arms.  You are confused.  You experience paralysis in your face or a drooping eyelid.   This information is not intended to replace advice given to you by your health care provider. Make sure you discuss any questions you have with your health care provider.   Document Released: 02/26/2001 Document Revised: 08/11/2015 Document Reviewed: 03/26/2014 Elsevier Interactive Patient Education [employer].

## 2016-09-18 NOTE — Progress Notes (Signed)
[address]       [place] [address]             [phone]                    [name] [facility] Medical Record #[id number] Date of Birth: 05/27/1956  Referring: Juanito Doom, MD Primary Care: Horatio Pel, MD  Chief Complaint:    Chief Complaint  Patient presents with  . Lung Lesion    LULobe...surgical eval, PET Scan 09/08/16, Chest CT 09/04/16, SPIROMETRY 08/28/16    History of Present Illness:    [name] 60 y.o. female is seen in the office  today for I want her to have a needle aspiration of her cervical lymphadenopathy by interventional radiology.  Also, because of the appearance of the nodule and lack of clear mediastinal lymphadenopathy I would like for her to have a thoracic surgery evaluation for lobectomy possibly with EBUS.  She has been having chest pain  the pain changes frequently.  She will have dull substernal pain, sometimes it radiates to her neck, sometimes it radiates to her left breast. The pain has been present since 03/2015, was worse this year in May   She has smoked over the years,  started age 29 and smokes 1 1/2 ppd . She is smoking about 1 pack of cigarettes per month at this point.  She has been vaping 2.5 years with nicotine containing solution,  Abnormal chest xray lead to ct and PET of chest   Current Activity/ Functional Status:  Patient is independent with mobility/ambulation, transfers, ADL's, IADL's.   Zubrod Score: At the time of surgery this patient's most appropriate activity status/level should be described as: '[]'$     0    Normal activity, no symptoms '[x]'$     1    Restricted in physical strenuous activity but ambulatory, able to do out light work '[]'$     2    Ambulatory and capable of self care, unable to do work activities, up and about               >50 % of waking hours                              '[]'$     3    Only limited self care, in bed greater than 50% of waking hours '[]'$      4    Completely disabled, no self care, confined to bed or chair '[]'$     5    Moribund   Past Medical History:  Diagnosis Date  . ADD (attention deficit disorder)   . Carotid stenosis   . Cervical spondylosis without myelopathy   . COPD (chronic obstructive pulmonary disease) ([place])   . Costochondritis   . Fibromyalgia   . GERD (gastroesophageal reflux disease)   . Lumbosacral spondylosis without myelopathy   . Migraine   . Mitral valve prolapse   . MRSA (methicillin resistant staph aureus) culture positive   . Myalgia and myositis, unspecified   . Palpitations   . Paroxysmal supraventricular tachycardia ([place])   . SVT (supraventricular tachycardia) ([place])    very brief runs  . Tobacco abuse     Past Surgical History:  Procedure Laterality Date  . CHOLECYSTECTOMY    . INCISION AND DRAINAGE [place] SURGERY  x 2  . TRIGGER FINGER RELEASE    . WISDOM TOOTH EXTRACTION      Family History  Problem Relation Age of Onset  . Hyperlipidemia Mother   . Hypertension Mother   . Hyperlipidemia Father   . Hypertension Father   . Diabetes Father   . Hypertension Brother   . Hyperlipidemia Brother     Social History   Social History  . Marital status: Divorced    Spouse name: N/A  . Number of children: N/A  . Years of education: N/A   Occupational History  . Not on file.   Social History Main Topics  . Smoking status: Current Every Day Smoker    Packs/day: 1.50    Types: Cigarettes  . Smokeless tobacco: Never Used     Comment: uses vape as well.   . Alcohol use No  . Drug use: No  . Sexual activity: Not on file     Comment: >10 years   Other Topics Concern  . Not on file   Social History Narrative  . No narrative on file    History  Smoking Status  . Current Every Day Smoker  . Packs/day: 1.50  . Types: Cigarettes  Smokeless Tobacco  . Never Used    Comment: uses vape as well.     History  Alcohol Use No      Allergies  Allergen Reactions  . Carafate [Sucralfate]   . Cefaclor     REACTION: Hives and swelling  . Depakote [Divalproex Sodium]   . Nitrofurantoin     REACTION: Hives and swelling  . Sulfamethoxazole     Other reaction(s): Unknown  . Wellbutrin [Bupropion]     Current Outpatient Prescriptions  Medication Sig Dispense Refill  . Ascorbic Acid (VITAMIN C) 1000 MG tablet Take 1,000 mg by mouth 2 (two) times daily.    Marland Kitchen aspirin EC 81 MG tablet Take 81 mg by mouth daily.    . Calcium Carbonate-Vit D-Min (CALCIUM 1200 PO) Take 1,200 mg by mouth 2 (two) times daily.    . cetirizine (ZYRTEC) 10 MG tablet Take 10 mg by mouth daily as needed for allergies.    Marland Kitchen dexlansoprazole (DEXILANT) 60 MG capsule Take 60 mg by mouth daily.    . diphenhydrAMINE (BENADRYL) 25 MG tablet Take 25 mg by mouth every 6 (six) hours as needed.    . docusate sodium (COLACE) 100 MG capsule Take 100 mg by mouth 2 (two) times daily as needed for constipation.    Marland Kitchen FIORICET 50-300-40 MG CAPS 1 to 2 every 4-6 hrs as needed 60 capsule 0  . fluticasone (FLONASE) 50 MCG/ACT nasal spray Place 2 sprays into both nostrils daily.    . Ginger, Zingiber officinalis, (GINGER ROOT) 550 MG CAPS Take 1-2 capsules by mouth 3 times/day as needed-between meals & bedtime.    Marland Kitchen loratadine (CLARITIN) 10 MG tablet Take 10 mg by mouth daily as needed for allergies.    . medroxyPROGESTERone (PROVERA) 5 MG tablet Take 2.5-5 mg by mouth daily.    . methocarbamol (ROBAXIN) 750 MG tablet Take 750 mg by mouth 4 (four) times daily.    . Multiple Vitamin (MULTIVITAMIN WITH MINERALS) TABS Take 1 tablet by mouth daily.    Marland Kitchen oxyCODONE-acetaminophen (PERCOCET) 7.5-325 MG per tablet Take 1 tablet by mouth every 4 (four) hours as needed for pain (1-3 tabs prn daily).     . POLYETHYLENE GLYCOL 3350 PO Take by mouth daily.    Marland Kitchen  pregabalin (LYRICA) 150 MG capsule Take 150 mg by mouth 2 (two) times daily.    Marland Kitchen pyridoxine (B-6) 100 MG tablet Take  100 mg by mouth 2 (two) times daily.    . ranitidine (ZANTAC) 150 MG tablet Take 150 mg by mouth 3 times/day as needed-between meals & bedtime for heartburn.    . Tiotropium Bromide-Olodaterol (STIOLTO RESPIMAT) 2.5-2.5 MCG/ACT AERS Inhale 2 puffs into the lungs daily. 1 Inhaler 0  . TURMERIC PO Take by mouth 3 (three) times daily.    Marland Kitchen venlafaxine (EFFEXOR) 75 MG tablet Take 75 mg by mouth 3 (three) times daily with meals.    Marland Kitchen zolpidem (AMBIEN) 10 MG tablet Take 10 mg by mouth at bedtime as needed for sleep.     No current facility-administered medications for this visit.       Review of Systems:     Cardiac Review of Systems: Y or N  Chest Pain [ y   ]  Resting SOB [  y ] Exertional SOB  [ y ]  Vertell Limber Blue.Reese  ]   Pedal Edema [ n  ]    Palpitations [ n ] Syncope  [ n ]   Presyncope [  n ]  General Review of Systems: [Y] = yes [  ]=no Constitional: recent weight change [ n ];  Wt loss over the last 3 months [   ] anorexia [  ]; fatigue [ y ]; nausea [  ]; night sweats [  ]; fever [  ]; or chills [  ];          Dental: poor dentition[  ]; Last Dentist visit:   Eye : blurred vision [  ]; diplopia [   ]; vision changes [  ];  Amaurosis fugax[  ]; Resp: cough [ y ];  wheezing[y  ];  hemoptysis[ n ]; shortness of breath[y  ]; paroxysmal nocturnal dyspnea[y  ]; dyspnea on exertion[ y ]; or orthopnea[  ];  GI:  gallstones[  ], vomiting[  ];  dysphagia[  ]; melena[  ];  hematochezia [  ]; heartburn[  ];   Hx of  Colonoscopy[  ]; GU: kidney stones [  ]; hematuria[  ];   dysuria [  ];  nocturia[  ];  history of     obstruction [  ]; urinary frequency [  ]             Skin: rash, swelling[  ];, hair loss[  ];  peripheral edema[  ];  or itching[  ]; Musculosketetal: myalgias[ y ];  joint swelling[ y ];  joint erythema[  ];  joint pain[y  ];  back pain[ y ];  Heme/Lymph: bruising[  ];  bleeding[  ];  anemia[  ];  Neuro: TIA[n  ];  headaches[  ];  stroke[  ];  vertigo[  ];  seizures[  ];   paresthesias[   ];  difficulty walking[  ];  Psych:depression[  ]; anxiety[  ];  Endocrine: diabetes[  ];  thyroid dysfunction[  ];  Immunizations: Flu up to date [  ]; Pneumococcal up to date [  ];  Other:  Physical Exam: BP 131/87   Pulse (!) 108   Resp 16   Ht '5\' 7"'$  (1.702 m)   Wt 162 lb (73.5 kg)   LMP 09/07/2006 (Within Years) Comment: >10 years  SpO2 98% Comment: RA  BMI 25.37 kg/m   PHYSICAL EXAMINATION: General appearance: alert and cooperative Head: Normocephalic,  without obvious abnormality, atraumatic Neck: no adenopathy, no carotid bruit, no JVD, supple, symmetrical, trachea midline and thyroid not enlarged, symmetric, no tenderness/mass/nodules Lymph nodes: Cervical, supraclavicular, and axillary nodes normal. Resp: clear to auscultation bilaterally Back: symmetric, no curvature. ROM normal. No CVA tenderness. Cardio: regular rate and rhythm, S1, S2 normal, no murmur, click, rub or gallop GI: soft, non-tender; bowel sounds normal; no masses,  no organomegaly Extremities: extremities normal, atraumatic, no cyanosis or edema Neurologic: Grossly normal  Full dp and pt pulses bilateral   Diagnostic Studies & Laboratory data:     Recent Radiology Findings:   Ct Chest Wo Contrast  Result Date: 09/04/2016 CLINICAL DATA:  Chest pain and shortness of breath off and on for several months EXAM: CT CHEST WITHOUT CONTRAST TECHNIQUE: Multidetector CT imaging of the chest was performed following the standard protocol without IV contrast. COMPARISON:  07/27/2016 FINDINGS: Cardiovascular: Somewhat limited due the lack of IV contrast. Aortic calcifications are noted without aneurysmal dilatation. Coronary calcifications are seen as well. Mediastinum/Nodes: The thoracic inlet is within normal limits. Few scattered small AP window nodes are seen. The largest of these measures approximately 8 mm in short axis. No definitive hilar adenopathy is noted. The esophagus as visualize is within normal limits.  Lungs/Pleura: Emphysematous changes are noted bilaterally. The lungs are well aerated without focal infiltrate or sizable effusion. In the left upper lobe there is a spiculated mass lesion which measures approximately 1.7 cm in greatest dimension on axial imaging consistent with primary pulmonary neoplasm. On the coronal imaging the lesion measures approximately 2.7 cm and has some linear density extending superiorly to the pleural margin. This may represent some localized extension. No other nodules are identified. Upper Abdomen: Within normal limits. Musculoskeletal: No chest wall mass or suspicious bone lesions identified. IMPRESSION: Spiculated mass lesion in the left upper lobe as described above consistent with primary pulmonary neoplasm. Few scattered AP window nodes are seen. PET-CT is recommended for further evaluation These results will be called to the ordering clinician or representative by the Radiologist Assistant, and communication documented in the PACS or zVision Dashboard. Electronically Signed   By: Inez Catalina M.D.   On: 09/04/2016 11:38   Nm Pet Image Initial (pi) Skull Base To Thigh  Result Date: 09/08/2016 CLINICAL DATA:  Initial treatment strategy for left upper lobe lung nodule. EXAM: NUCLEAR MEDICINE PET SKULL BASE TO THIGH TECHNIQUE: 1214 mCi F-18 FDG was injected intravenously. Full-ring PET imaging was performed from the skull base to thigh after the radiotracer. CT data was obtained and used for attenuation correction and anatomic localization. FASTING BLOOD GLUCOSE:  Value: 74 mg/dl COMPARISON:  Chest CT 09/04/2016. FINDINGS: NECK Mildly hypermetabolic bilateral level 2 nodes. A left-sided node measures 9 mm and a S.U.V. max of 3.1 on image 24/series 4. A right-sided node measures 8 mm and a S.U.V. max of 4.1 on image 25/series 4. CHEST Hypermetabolism corresponding to the left apical lung nodule. This measures 1.7 cm and a S.U.V. max of 9.1 on image 59/series 4. No thoracic nodal  hypermetabolism. ABDOMEN/PELVIS No areas of abnormal hypermetabolism. SKELETON Right Shoulder muscular activity is likely due to min after radiopharmaceutical injection. There is focal activity involving the left facet at C3-4, favored to be degenerative. This measures a S.U.V. max of 4.1, including on image 24/series 4. CT IMAGES PERFORMED FOR ATTENUATION CORRECTION Bilateral carotid atherosclerosis. Aortic and branch vessel atherosclerosis. Borderline cardiomegaly. Multivessel coronary artery atherosclerosis. Mild centrilobular emphysema. Other chest findings deferred to recent diagnostic CT.  No acute superimposed process. Normal adrenal glands. Cholecystectomy. Abdominal aortic atherosclerosis. Intrauterine device. IMPRESSION: 1. Left apical spiculated hypermetabolic pulmonary nodule, consistent with primary bronchogenic carcinoma. 2. No thoracic nodal metastasis. 3. Bilateral level 2 cervical nodes with mild hypermetabolism. Suspicious for but not diagnostic of a somewhat unusual initial distribution of nodal metastasis from primary bronchogenic carcinoma. As reactive activity could look similar, consider sampling. 4. Age advanced coronary artery atherosclerosis. Recommend assessment of coronary risk factors and consideration of medical therapy. 5.  Aortic atherosclerosis. Electronically Signed   By: Abigail Miyamoto M.D.   On: 09/08/2016 14:47     I have independently reviewed the above radiologic studies.  Recent Lab Findings: Lab Results  Component Value Date   WBC 16.5 (H) 10/08/2010   HGB 14.0 10/08/2010   HCT 40.8 10/08/2010   PLT 198 10/08/2010   GLUCOSE 102 (H) 10/08/2010   CHOL 182 04/09/2007   TRIG 68 04/09/2007   HDL 51 04/09/2007   LDLCALC 117 04/09/2007   ALT 51 (H) 10/08/2010   AST 64 (H) 10/08/2010   NA 136 10/08/2010   K 3.6 10/08/2010   CL 105 10/08/2010   CREATININE 0.68 10/08/2010   BUN 6 10/08/2010   CO2 26 10/08/2010      Assessment / Plan:     Left upper lobe mass  suggestive of primary lung cancer   PLAN  full PFT's Cardiology clearence /evaluation with age advanced coronary artery atherosclerosis with symptoms of chest pain  Needle bx of cervical nodes evident on PET, if negative consider , EBUS and left upper lobectomy  I  spent 40 minutes counseling the patient face to face and 50% or more the  time was spent in counseling and coordination of care. The total time spent in the appointment was 60 minutes.  [name] MD      [address] [place] [address] Office [phone]   Beeper [phone]  09/18/2016 4:10 PM

## 2016-09-19 ENCOUNTER — Other Ambulatory Visit: Payer: Self-pay | Admitting: Radiology

## 2016-09-19 ENCOUNTER — Other Ambulatory Visit: Payer: Self-pay | Admitting: *Deleted

## 2016-09-19 DIAGNOSIS — R911 Solitary pulmonary nodule: Secondary | ICD-10-CM

## 2016-09-21 ENCOUNTER — Encounter (HOSPITAL_COMMUNITY): Payer: Self-pay

## 2016-09-21 ENCOUNTER — Ambulatory Visit (HOSPITAL_COMMUNITY)
Admission: RE | Admit: 2016-09-21 | Discharge: 2016-09-21 | Disposition: A | Discharge status: Home / Self Care | Payer: Medicare Other | Source: Ambulatory Visit | Attending: Pulmonary Disease | Admitting: Pulmonary Disease

## 2016-09-21 DIAGNOSIS — R911 Solitary pulmonary nodule: Secondary | ICD-10-CM | POA: Diagnosis not present

## 2016-09-21 DIAGNOSIS — R590 Localized enlarged lymph nodes: Secondary | ICD-10-CM | POA: Diagnosis not present

## 2016-09-21 DIAGNOSIS — R011 Cardiac murmur, unspecified: Secondary | ICD-10-CM | POA: Diagnosis present

## 2016-09-21 LAB — CBC WITH DIFFERENTIAL/PLATELET
Basophils Absolute: 0.1 10*3/uL (ref 0.0–0.1)
Basophils Relative: 1 %
EOS ABS: 0.1 10*3/uL (ref 0.0–0.7)
Eosinophils Relative: 2 %
HEMATOCRIT: 40.5 % (ref 36.0–46.0)
HEMOGLOBIN: 13.6 g/dL (ref 12.0–15.0)
LYMPHS ABS: 2.9 10*3/uL (ref 0.7–4.0)
Lymphocytes Relative: 34 %
MCH: 31.1 pg (ref 26.0–34.0)
MCHC: 33.6 g/dL (ref 30.0–36.0)
MCV: 92.7 fL (ref 78.0–100.0)
MONOS PCT: 5 %
Monocytes Absolute: 0.4 10*3/uL (ref 0.1–1.0)
NEUTROS PCT: 58 %
Neutro Abs: 4.9 10*3/uL (ref 1.7–7.7)
Platelets: 238 10*3/uL (ref 150–400)
RBC: 4.37 MIL/uL (ref 3.87–5.11)
RDW: 12.7 % (ref 11.5–15.5)
WBC: 8.4 10*3/uL (ref 4.0–10.5)

## 2016-09-21 LAB — PROTIME-INR
INR: 0.97
PROTHROMBIN TIME: 12.9 s (ref 11.4–15.2)

## 2016-09-21 LAB — BASIC METABOLIC PANEL
Anion gap: 7 (ref 5–15)
BUN: 9 mg/dL (ref 6–20)
CHLORIDE: 106 mmol/L (ref 101–111)
CO2: 26 mmol/L (ref 22–32)
CREATININE: 0.78 mg/dL (ref 0.44–1.00)
Calcium: 9.1 mg/dL (ref 8.9–10.3)
GFR calc non Af Amer: >60 mL/min (ref >60)
Glucose, Bld: 100 mg/dL — ABNORMAL HIGH (ref 65–99)
Potassium: 3.5 mmol/L (ref 3.5–5.1)
Sodium: 139 mmol/L (ref 135–145)

## 2016-09-21 MED ORDER — LIDOCAINE HCL 1 % IJ SOLN
INTRAMUSCULAR | Status: AC
  Filled 2016-09-21: qty 20

## 2016-09-21 MED ORDER — FENTANYL CITRATE (PF) 100 MCG/2ML IJ SOLN
INTRAMUSCULAR | Status: AC | PRN
  Administered 2016-09-21 (×2): 50 ug via INTRAVENOUS

## 2016-09-21 MED ORDER — SODIUM CHLORIDE 0.9 % IV SOLN: INTRAVENOUS | Status: DC

## 2016-09-21 MED ORDER — MIDAZOLAM HCL 2 MG/2ML IJ SOLN
INTRAMUSCULAR | Status: AC | PRN
  Administered 2016-09-21 (×2): 1 mg via INTRAVENOUS

## 2016-09-21 MED ORDER — FENTANYL CITRATE (PF) 100 MCG/2ML IJ SOLN
INTRAMUSCULAR | Status: AC
  Filled 2016-09-21: qty 4

## 2016-09-21 MED ORDER — MIDAZOLAM HCL 2 MG/2ML IJ SOLN
INTRAMUSCULAR | Status: AC
  Filled 2016-09-21: qty 4

## 2016-09-21 MED ORDER — LIDOCAINE-EPINEPHRINE 1 %-1:100000 IJ SOLN
INTRAMUSCULAR | Status: AC
  Filled 2016-09-21: qty 1

## 2016-09-21 NOTE — Discharge Instructions (Signed)
Needle Biopsy, Care After °These instructions give you information about caring for yourself after your procedure. Your doctor may also give you more specific instructions. Call your doctor if you have any problems or questions after your procedure. °HOME CARE °· Rest as told by your doctor. °· Take medicines only as told by your doctor. °· There are many different ways to close and cover the biopsy site, including stitches (sutures), skin glue, and adhesive strips. Follow instructions from your doctor about: °¨ How to take care of your biopsy site. °¨ When and how you should change your bandage (dressing). °¨ When you should remove your dressing. °¨ Removing whatever was used to close your biopsy site. °· Check your biopsy site every day for signs of infection. Watch for: °¨ Redness, swelling, or pain. °¨ Fluid, blood, or pus. °GET HELP IF: °· You have a fever. °· You have redness, swelling, or pain at the biopsy site, and it lasts longer than a few days. °· You have fluid, blood, or pus coming from the biopsy site. °· You feel sick to your stomach (nauseous). °· You throw up (vomit). °GET HELP RIGHT AWAY IF: °· You are short of breath. °· You have trouble breathing. °· Your chest hurts. °· You feel dizzy or you pass out (faint). °· You have bleeding that does not stop with pressure or a bandage. °· You cough up blood. °· Your belly (abdomen) hurts. °  °This information is not intended to replace advice given to you by your health care provider. Make sure you discuss any questions you have with your health care provider. °  °Document Released: 11/02/2008 Document Revised: 04/06/2015 Document Reviewed: 11/16/2014 °Elsevier Interactive Patient Education ©2016 [employer] ° °

## 2016-09-21 NOTE — H&P (Signed)
Chief Complaint: left lung nodule and cervical lymphadenopathy  Referring Physician:Dr. Simonne Maffucci  Supervising Physician: Sandi Mariscal  Patient Status: Out-pt  HPI: [name] is an 60 y.o. female who was recently found to have a left lung nodule that is consistent with bronchogenic carcinoma.  She does have a tobacco history and has ben using "vaps" for the last 3 years.  She had a PET scan that revealed moderately hypermetabolic lymph nodes in her cervical region bilaterally.  A request has been made for a biopsy of one of these LNs and for her to follow up with a CTS for possible lobectomy of this lung nodule.  She presents today for this procedure.  She does admit to chest pain, but states it's musculoskeletal as she has costochondritis.  She has no other new complaints.      Past Medical History:  Past Medical History:  Diagnosis Date  . ADD (attention deficit disorder)   . Carotid stenosis   . Cervical spondylosis without myelopathy   . COPD (chronic obstructive pulmonary disease) ([place])   . Costochondritis   . Fibromyalgia   . GERD (gastroesophageal reflux disease)   . Lumbosacral spondylosis without myelopathy   . Migraine   . Mitral valve prolapse   . MRSA (methicillin resistant staph aureus) culture positive   . Myalgia and myositis, unspecified   . Palpitations   . Paroxysmal supraventricular tachycardia ([place])   . SVT (supraventricular tachycardia) ([place])    very brief runs  . Tobacco abuse     Past Surgical History:  Past Surgical History:  Procedure Laterality Date  . CHOLECYSTECTOMY    . INCISION AND DRAINAGE PERIRECTAL ABSCESS    . LUMBAR DISC SURGERY     x 2  . TRIGGER FINGER RELEASE    . WISDOM TOOTH EXTRACTION      Family History:  Family History  Problem Relation Age of Onset  . Hyperlipidemia Mother   . Hypertension Mother   . Hyperlipidemia Father   . Hypertension Father   . Diabetes Father   . Hypertension Brother   .  Hyperlipidemia Brother     Social History:  reports that she has been smoking Cigarettes.  She has been smoking about 1.50 packs per day. She has never used smokeless tobacco. She reports that she does not drink alcohol or use drugs.  Allergies:  Allergies  Allergen Reactions  . Carafate [Sucralfate]   . Cefaclor     REACTION: Hives and swelling  . Depakote [Divalproex Sodium]   . Nitrofurantoin     REACTION: Hives and swelling  . Sulfamethoxazole     Other reaction(s): Unknown  . Wellbutrin [Bupropion]     Medications: Medications have been reviewed in Epic  Please HPI for pertinent positives, otherwise complete 10 system ROS negative.  Mallampati Score: MD Evaluation Airway: WNL Heart: WNL Abdomen: WNL Chest/ Lungs: WNL ASA  Classification: 3 Mallampati/Airway Score: Two  Physical Exam: BP 112/74   Pulse 85   Temp 98.9 F (37.2 C) (Oral)   Resp 16   Ht 5' 7" (1.702 m)   Wt 160 lb (72.6 kg)   LMP 09/07/2006 (Within Years) Comment: >10 years  SpO2 98%   BMI 25.06 kg/m  Body mass index is 25.06 kg/m. General: pleasant, WD, WN white female who is laying in bed in NAD HEENT: head is normocephalic, atraumatic.  Sclera are noninjected.  PERRL.  Ears and nose without any masses or lesions.  Mouth is pink  and moist Heart: regular, rate, and rhythm.  Normal s1,s2. No obvious murmurs, gallops, or rubs noted.  Palpable radial pulses bilaterally Lungs: CTAB, no wheezes, rhonchi, or rales noted.  Respiratory effort nonlabored Abd: soft, NT, ND, +BS, no masses, hernias, or organomegaly Psych: A&Ox3 with an appropriate affect.   Labs: Results for orders placed or performed during the hospital encounter of 09/21/16 (from the past 48 hour(s))  Basic metabolic panel     Status: Abnormal   Collection Time: 09/21/16  6:00 AM  Result Value Ref Range   Sodium 139 135 - 145 mmol/L   Potassium 3.5 3.5 - 5.1 mmol/L   Chloride 106 101 - 111 mmol/L   CO2 26 22 - 32 mmol/L    Glucose, Bld 100 (H) 65 - 99 mg/dL   BUN 9 6 - 20 mg/dL   Creatinine, Ser 0.78 0.44 - 1.00 mg/dL   Calcium 9.1 8.9 - 10.3 mg/dL   GFR calc non Af Amer >60 >60 mL/min   GFR calc Af Amer >60 >60 mL/min    Comment: (NOTE) The eGFR has been calculated using the CKD EPI equation. This calculation has not been validated in all clinical situations. eGFR's persistently <60 mL/min signify possible Chronic Kidney Disease.    Anion gap 7 5 - 15  CBC with Differential/Platelet     Status: None   Collection Time: 09/21/16  6:00 AM  Result Value Ref Range   WBC 8.4 4.0 - 10.5 K/uL   RBC 4.37 3.87 - 5.11 MIL/uL   Hemoglobin 13.6 12.0 - 15.0 g/dL   HCT 40.5 36.0 - 46.0 %   MCV 92.7 78.0 - 100.0 fL   MCH 31.1 26.0 - 34.0 pg   MCHC 33.6 30.0 - 36.0 g/dL   RDW 12.7 11.5 - 15.5 %   Platelets 238 150 - 400 K/uL   Neutrophils Relative % 58 %   Neutro Abs 4.9 1.7 - 7.7 K/uL   Lymphocytes Relative 34 %   Lymphs Abs 2.9 0.7 - 4.0 K/uL   Monocytes Relative 5 %   Monocytes Absolute 0.4 0.1 - 1.0 K/uL   Eosinophils Relative 2 %   Eosinophils Absolute 0.1 0.0 - 0.7 K/uL   Basophils Relative 1 %   Basophils Absolute 0.1 0.0 - 0.1 K/uL  Protime-INR     Status: None   Collection Time: 09/21/16  6:00 AM  Result Value Ref Range   Prothrombin Time 12.9 11.4 - 15.2 seconds   INR 0.97     Imaging: No results found.  Assessment/Plan 1. Left lung nodule with cervical lymphadenopathy -we will proceed today with US guided cervical LN BX -labs and vitals have been reviewed -Risks and Benefits discussed with the patient including, but not limited to bleeding, infection, damage to adjacent structures or low yield requiring additional tests. All of the patient's questions were answered, patient is agreeable to proceed. Consent signed and in chart.  Thank you for this interesting consult.  I greatly enjoyed meeting [name] and look forward to participating in their care.  A copy of this report  was sent to the requesting provider on this date.  Electronically Signed: Henreitta Cea 09/21/2016, 8:09 AM   I spent a total of  30 Minutes  in face to face in clinical consultation, greater than 50% of which was counseling/coordinating care for cervical LAD

## 2016-09-21 NOTE — Sedation Documentation (Signed)
Patient is resting comfortably. 

## 2016-09-21 NOTE — Procedures (Signed)
Technically successful US guided biopsy of left cervical LN.   EBL: None No immediate complications.   Ronny Bacon, MD Pager #: [phone]

## 2016-09-25 ENCOUNTER — Ambulatory Visit (HOSPITAL_COMMUNITY)
Admission: RE | Admit: 2016-09-25 | Discharge: 2016-09-25 | Disposition: A | Discharge status: Home / Self Care | Payer: Medicare Other | Source: Ambulatory Visit | Attending: Cardiothoracic Surgery | Admitting: Cardiothoracic Surgery

## 2016-09-25 DIAGNOSIS — R911 Solitary pulmonary nodule: Secondary | ICD-10-CM

## 2016-09-25 LAB — PULMONARY FUNCTION TEST
DL/VA % pred: 69 %
DL/VA: 3.58 ml/min/mmHg/L
DLCO unc % pred: 65 %
DLCO unc: 18.52 ml/min/mmHg
FEF 25-75 Post: 1.45 L/sec
FEF 25-75 Pre: 1.33 L/sec
FEF2575-%Change-Post: 9 %
FEF2575-%Pred-Post: 55 %
FEF2575-%Pred-Pre: 50 %
FEV1-%Change-Post: 4 %
FEV1-%Pred-Post: 74 %
FEV1-%Pred-Pre: 70 %
FEV1-Post: 2.15 L
FEV1-Pre: 2.05 L
FEV1FVC-%Change-Post: -1 %
FEV1FVC-%Pred-Pre: 85 %
FEV6-%Change-Post: 7 %
FEV6-%Pred-Post: 89 %
FEV6-%Pred-Pre: 83 %
FEV6-Post: 3.23 L
FEV6-Pre: 3 L
FEV6FVC-%Change-Post: 0 %
FEV6FVC-%Pred-Post: 101 %
FEV6FVC-%Pred-Pre: 100 %
FVC-%Change-Post: 6 %
FVC-%Pred-Post: 88 %
FVC-%Pred-Pre: 82 %
FVC-Post: 3.29 L
FVC-Pre: 3.1 L
Post FEV1/FVC ratio: 65 %
Post FEV6/FVC ratio: 98 %
Pre FEV1/FVC ratio: 66 %
Pre FEV6/FVC Ratio: 98 %
RV % pred: 134 %
RV: 2.82 L
TLC % pred: 112 %
TLC: 6.2 L

## 2016-09-25 MED ORDER — ALBUTEROL SULFATE (2.5 MG/3ML) 0.083% IN NEBU
2.5000 mg | INHALATION_SOLUTION | Freq: Once | RESPIRATORY_TRACT | Status: AC
  Administered 2016-09-25: 2.5 mg via RESPIRATORY_TRACT

## 2016-09-27 DIAGNOSIS — M47816 Spondylosis without myelopathy or radiculopathy, lumbar region: Secondary | ICD-10-CM | POA: Diagnosis not present

## 2016-09-27 DIAGNOSIS — M961 Postlaminectomy syndrome, not elsewhere classified: Secondary | ICD-10-CM | POA: Diagnosis not present

## 2016-09-27 DIAGNOSIS — M47812 Spondylosis without myelopathy or radiculopathy, cervical region: Secondary | ICD-10-CM | POA: Diagnosis not present

## 2016-09-27 DIAGNOSIS — G894 Chronic pain syndrome: Secondary | ICD-10-CM | POA: Diagnosis not present

## 2016-10-02 ENCOUNTER — Ambulatory Visit (INDEPENDENT_AMBULATORY_CARE_PROVIDER_SITE_OTHER): Payer: Medicare Other | Admitting: Cardiovascular Disease

## 2016-10-02 ENCOUNTER — Encounter: Payer: Self-pay | Admitting: Cardiovascular Disease

## 2016-10-02 VITALS — BP 96/60 | HR 89 | Ht 67.0 in | Wt 163.0 lb

## 2016-10-02 DIAGNOSIS — I471 Supraventricular tachycardia: Secondary | ICD-10-CM | POA: Diagnosis not present

## 2016-10-02 DIAGNOSIS — J449 Chronic obstructive pulmonary disease, unspecified: Secondary | ICD-10-CM

## 2016-10-02 DIAGNOSIS — Z01810 Encounter for preprocedural cardiovascular examination: Secondary | ICD-10-CM | POA: Diagnosis not present

## 2016-10-02 DIAGNOSIS — I2510 Atherosclerotic heart disease of native coronary artery without angina pectoris: Secondary | ICD-10-CM | POA: Insufficient documentation

## 2016-10-02 NOTE — Progress Notes (Signed)
Cardiology Office Note    Date:  10/02/2016   ID:  [name], DOB January 04, 1956, MRN [id number]  PCP:  Horatio Pel, MD  Cardiologist:   Sanda Klein, MD   Chief Complaint  Patient presents with  . Follow-up    PT C/O SOB AND CHEST PAIN AND NEEDS CLEARANCE FOR SURGERY    History of Present Illness:  [name] is a 60 y.o. female with ectopic atrial tachycardia, moderate COPD, mild hyperlipidemia, strong family history of coronary disease, migraines and fibromyalgia some bleed diagnosed with a 1.7x 2.7 cm spiculated and hypermetabolic nodule in her left upper lung. She is seeing Dr. Servando Snare with plan for possible left upper lobectomy in the near future. Biopsy of a hypermetabolic site in her cervical lymph nodes showed benign findings. Chest CT also showed evidence of "age advanced" coronary atherosclerotic calcification.   Her palpitations have been very well controlled and have not really bothered her much. She has had some issues with costochondritis over the last year, still ongoing (clearly tender to touch and improved with ibuprofen). Denies exertional chest pain and has only mild exertional dyspnea. She denies leg edema, orthopnea, PND, frequent wheezing. He has had a persistent dry cough, but no hemoptysis. Chronic back pain is the major limiting factor for physical activity.    Past Medical History:  Diagnosis Date  . ADD (attention deficit disorder)   . Carotid stenosis   . Cervical spondylosis without myelopathy   . COPD (chronic obstructive pulmonary disease) ([place])   . Costochondritis   . Fibromyalgia   . GERD (gastroesophageal reflux disease)   . Lumbosacral spondylosis without myelopathy   . Migraine   . Mitral valve prolapse   . MRSA (methicillin resistant staph aureus) culture positive   . Myalgia and myositis, unspecified   . Palpitations   . Paroxysmal supraventricular tachycardia ([place])   . SVT (supraventricular tachycardia)  ([place])    very brief runs  . Tobacco abuse     Past Surgical History:  Procedure Laterality Date  . CHOLECYSTECTOMY    . INCISION AND DRAINAGE PERIRECTAL ABSCESS    . LUMBAR DISC SURGERY     x 2  . TRIGGER FINGER RELEASE    . WISDOM TOOTH EXTRACTION      Current Medications: Outpatient Medications Prior to Visit  Medication Sig Dispense Refill  . Ascorbic Acid (VITAMIN C) 1000 MG tablet Take 1,000 mg by mouth 2 (two) times daily.    Marland Kitchen aspirin EC 81 MG tablet Take 81 mg by mouth daily.    . Calcium Carbonate-Vit D-Min (CALCIUM 1200 PO) Take 1,200 mg by mouth 2 (two) times daily.    . cetirizine (ZYRTEC) 10 MG tablet Take 10 mg by mouth daily as needed for allergies.    Marland Kitchen dexlansoprazole (DEXILANT) 60 MG capsule Take 60 mg by mouth daily.    . diphenhydrAMINE (BENADRYL) 25 MG tablet Take 25 mg by mouth every 6 (six) hours as needed.    . docusate sodium (COLACE) 100 MG capsule Take 100 mg by mouth 2 (two) times daily as needed for constipation.    Marland Kitchen FIORICET 50-300-40 MG CAPS 1 to 2 every 4-6 hrs as needed 60 capsule 0  . fluticasone (FLONASE) 50 MCG/ACT nasal spray Place 2 sprays into both nostrils daily.    . Ginger, Zingiber officinalis, (GINGER ROOT) 550 MG CAPS Take 1-2 capsules by mouth 3 times/day as needed-between meals & bedtime.    Marland Kitchen loratadine (CLARITIN) 10 MG tablet  Take 10 mg by mouth daily as needed for allergies.    . medroxyPROGESTERone (PROVERA) 5 MG tablet Take 2.5-5 mg by mouth daily.    . methocarbamol (ROBAXIN) 750 MG tablet Take 750 mg by mouth 4 (four) times daily.    . Multiple Vitamin (MULTIVITAMIN WITH MINERALS) TABS Take 1 tablet by mouth daily.    Marland Kitchen oxyCODONE-acetaminophen (PERCOCET) 7.5-325 MG per tablet Take 1 tablet by mouth every 4 (four) hours as needed for pain (1-3 tabs prn daily).     . POLYETHYLENE GLYCOL 3350 PO Take by mouth daily.    . pregabalin (LYRICA) 150 MG capsule Take 150 mg by mouth 2 (two) times daily.    Marland Kitchen pyridoxine (B-6) 100 MG  tablet Take 100 mg by mouth 2 (two) times daily.    . ranitidine (ZANTAC) 150 MG tablet Take 150 mg by mouth 3 times/day as needed-between meals & bedtime for heartburn.    . Tiotropium Bromide-Olodaterol (STIOLTO RESPIMAT) 2.5-2.5 MCG/ACT AERS Inhale 2 puffs into the lungs daily. 1 Inhaler 0  . TURMERIC PO Take by mouth 3 (three) times daily.    Marland Kitchen venlafaxine (EFFEXOR) 75 MG tablet Take 75 mg by mouth 3 (three) times daily with meals.    Marland Kitchen zolpidem (AMBIEN) 10 MG tablet Take 10 mg by mouth at bedtime as needed for sleep.     No facility-administered medications prior to visit.      Allergies:   Carafate [sucralfate]; Cefaclor; Depakote [divalproex sodium]; Nitrofurantoin; Sulfamethoxazole; and Wellbutrin [bupropion]   Social History   Social History  . Marital status: Divorced    Spouse name: N/A  . Number of children: N/A  . Years of education: N/A   Social History Main Topics  . Smoking status: Current Every Day Smoker    Packs/day: 1.50    Types: E-cigarettes  . Smokeless tobacco: Never Used     Comment: ONLY USING VAPE   . Alcohol use No  . Drug use: No  . Sexual activity: Not Asked     Comment: >10 years   Other Topics Concern  . None   Social History Narrative  . None     Family History:  The patient's family history includes Diabetes in her father; Hyperlipidemia in her brother, father, and mother; Hypertension in her brother, father, and mother.   ROS:   Please see the history of present illness.    ROS All other systems reviewed and are negative.   PHYSICAL EXAM:   VS:  BP 96/60 (BP Location: Left Arm, Patient Position: Sitting, Cuff Size: Normal)   Pulse 89   Ht '5\' 7"'$  (1.702 m)   Wt 163 lb (73.9 kg)   LMP 09/07/2006 (Within Years) Comment: >10 years  SpO2 98%   BMI 25.53 kg/m    GEN: Well nourished, well developed, in no acute distress  HEENT: normal  Neck: no JVD, carotid bruits, or masses Cardiac: RRR; no murmurs, rubs, or gallops,no edema    Respiratory:  clear to auscultation bilaterally, normal work of breathing GI: soft, nontender, nondistended, + BS MS: no deformity or atrophy  Skin: warm and dry, no rash Neuro:  Alert and Oriented x 3, Strength and sensation are intact Psych: euthymic mood, full affect  Wt Readings from Last 3 Encounters:  10/02/16 163 lb (73.9 kg)  09/21/16 160 lb (72.6 kg)  09/18/16 162 lb (73.5 kg)      Studies/Labs Reviewed:   EKG:  EKG is ordered today.  The ekg ordered  today demonstrates Sinus rhythm, mild pre-existing nonspecific ST segment changes in the inferolateral leads  Recent Labs: 09/21/2016: BUN 9; Creatinine, Ser 0.78; Hemoglobin 13.6; Platelets 238; Potassium 3.5; Sodium 139   Lipid Panel    Component Value Date/Time   CHOL 182 04/09/2007   TRIG 68 04/09/2007   HDL 51 04/09/2007   LDLCALC 117 04/09/2007    Additional studies/ records that were reviewed today include:  Imaging studies, notes from Dr. Servando Snare and [place] [facility]    ASSESSMENT:    1. Coronary artery calcification seen on CAT scan   2. Preoperative cardiovascular examination   3. Paroxysmal atrial tachycardia ([place])   4. COPD mixed type ([place])      PLAN:  In order of problems listed above:  1. Coronary calcification: She has smoked her whole life and has a family history of coronary artery disease. Recommend Lexiscan Myoview. She cannot exercise on the treadmill due to chronic back pain. Although some dull retrosternal chest pain is mentioned in her notes, on my interview today the symptoms she describes sound atypical for costochondritis only. 2. Preop risk evaluation: Will be based on the findings of her nuclear stress test, I anticipate that the cardiovascular risk is low. I would set the threshold for invasive coronary angiography relatively high. With all the perform angiograms for a clearly high risk/anterior wall ischemia study, to avoid delay in her lung surgery. 3. PAT: Is quite possible that she will  develop symptomatic atrial tachycardia if she has lung surgery. We have been avoiding beta blockers due to her obstructive lung disease. I'm not sure that she would benefit from empirical addition of centrally acting calcium channel blockers, especially since her blood pressure is so low. Would treat the episodes if and as they occur. 4. COPD: now seeing Dr. [place] Bells. GOLD C. Suspected left upper lobe malignancy.    Medication Adjustments/Labs and Tests Ordered: Current medicines are reviewed at length with the patient today.  Concerns regarding medicines are outlined above.  Medication changes, Labs and Tests ordered today are listed in the Patient Instructions below. Patient Instructions  Medication Instructions: Dr Sallyanne Kuster recommends that you continue on your current medications as directed. Please refer to the Current Medication list given to you today.  Labwork: NONE ORDERED  Testing/Procedures: 1. [place] Stress test - Your physician has requested that you have a lexiscan myoview. For further information please visit [email or web address]. Please follow instruction sheet, as given.  Follow-up: Dr Sallyanne Kuster recommends that you schedule a follow-up appointment in 1 year. You will receive a reminder letter in the mail two months in advance. If you don't receive a letter, please call our office to schedule the follow-up appointment.  If you need a refill on your cardiac medications before your next appointment, please call your pharmacy.    Signed, Sanda Klein, MD  10/02/2016 2:15 PM    [facility] [place], [place], [place]  [address] Phone: [phone]; Fax: [phone]

## 2016-10-02 NOTE — Patient Instructions (Signed)
Medication Instructions: Dr Sallyanne Kuster recommends that you continue on your current medications as directed. Please refer to the Current Medication list given to you today.  Labwork: NONE ORDERED  Testing/Procedures: 1. [place] Stress test - Your physician has requested that you have a lexiscan myoview. For further information please visit [email or web address]. Please follow instruction sheet, as given.  Follow-up: Dr Sallyanne Kuster recommends that you schedule a follow-up appointment in 1 year. You will receive a reminder letter in the mail two months in advance. If you don't receive a letter, please call our office to schedule the follow-up appointment.  If you need a refill on your cardiac medications before your next appointment, please call your pharmacy.

## 2016-10-04 ENCOUNTER — Ambulatory Visit (HOSPITAL_COMMUNITY): Payer: Medicare Other | Attending: Cardiovascular Disease

## 2016-10-04 DIAGNOSIS — I2510 Atherosclerotic heart disease of native coronary artery without angina pectoris: Secondary | ICD-10-CM | POA: Diagnosis not present

## 2016-10-04 LAB — MYOCARDIAL PERFUSION IMAGING
LHR: 0.29
LV dias vol: 66 mL (ref 46–106)
LVSYSVOL: 21 mL
NUC STRESS TID: 1.02
Peak HR: 93 {beats}/min
Rest HR: 74 {beats}/min
SDS: 2
SRS: 3
SSS: 5

## 2016-10-04 MED ORDER — REGADENOSON 0.4 MG/5ML IV SOLN
0.4000 mg | Freq: Once | INTRAVENOUS | Status: AC
  Administered 2016-10-04: 0.4 mg via INTRAVENOUS

## 2016-10-04 MED ORDER — TECHNETIUM TC 99M TETROFOSMIN IV KIT
10.5000 | PACK | Freq: Once | INTRAVENOUS | Status: AC | PRN
  Administered 2016-10-04: 10.5 via INTRAVENOUS
  Filled 2016-10-04: qty 11

## 2016-10-04 MED ORDER — TECHNETIUM TC 99M TETROFOSMIN IV KIT
31.6000 | PACK | Freq: Once | INTRAVENOUS | Status: AC | PRN
  Administered 2016-10-04: 31.6 via INTRAVENOUS
  Filled 2016-10-04: qty 32

## 2016-10-05 ENCOUNTER — Telehealth: Payer: Self-pay | Admitting: Cardiovascular Disease

## 2016-10-05 ENCOUNTER — Ambulatory Visit (INDEPENDENT_AMBULATORY_CARE_PROVIDER_SITE_OTHER): Payer: Medicare Other | Admitting: Cardiothoracic Surgery

## 2016-10-05 ENCOUNTER — Encounter: Payer: Self-pay | Admitting: Cardiothoracic Surgery

## 2016-10-05 ENCOUNTER — Other Ambulatory Visit: Payer: Self-pay | Admitting: *Deleted

## 2016-10-05 VITALS — BP 111/70 | HR 80 | Resp 16 | Ht 67.0 in | Wt 163.0 lb

## 2016-10-05 DIAGNOSIS — J449 Chronic obstructive pulmonary disease, unspecified: Secondary | ICD-10-CM

## 2016-10-05 DIAGNOSIS — R911 Solitary pulmonary nodule: Secondary | ICD-10-CM

## 2016-10-05 DIAGNOSIS — I471 Supraventricular tachycardia: Secondary | ICD-10-CM

## 2016-10-05 DIAGNOSIS — D491 Neoplasm of unspecified behavior of respiratory system: Secondary | ICD-10-CM

## 2016-10-05 NOTE — Progress Notes (Signed)
[address]       [place] [address]             [phone]                    Brandy L Hoeger Callender Medical Record #[id number] Date of Birth: 1956-01-11  Referring: Juanito Doom, MD Primary Care: Horatio Pel, MD  Chief Complaint:    Chief Complaint  Patient presents with  . Lung Lesion    2 wk f/u after PFT and CARDIAC CLEARANCE/normal stress test    History of Present Illness:    [name] 60 y.o. female is seen in follow up for left upper lobe lung nodule. A needle aspiration of her cervical lymphadenopathy by interventional radiology was negative .  Also, because of the appearance of the nodule and lack of clear mediastinal lymphadenopathy she was seen for  evaluation for lobectomy.  She has been having chest pain  the pain changes frequently.  She will have dull substernal pain, sometimes it radiates to her neck, sometimes it radiates to her left breast. The pain has been present since 03/2015, was worse this year in May   She has smoked over the years,  started age 43 and smokes 1 1/2 ppd . She is smoking about 1 pack of cigarettes per month at this point.  She has been vaping 2.5 years with nicotine containing solution,  Abnormal chest xray lead to ct and PET of chest   Cardiology clearence done by Dr Sallyanne Kuster  Current Activity/ Functional Status:  Patient is independent with mobility/ambulation, transfers, ADL's, IADL's.   Zubrod Score: At the time of surgery this patient's most appropriate activity status/level should be described as: '[]'$     0    Normal activity, no symptoms '[x]'$     1    Restricted in physical strenuous activity but ambulatory, able to do out light work '[]'$     2    Ambulatory and capable of self care, unable to do work activities, up and about               >50 % of waking hours                              '[]'$     3    Only limited self care, in bed greater than 50% of waking hours '[]'$     4     Completely disabled, no self care, confined to bed or chair '[]'$     5    Moribund   Past Medical History:  Diagnosis Date  . ADD (attention deficit disorder)   . Carotid stenosis   . Cervical spondylosis without myelopathy   . COPD (chronic obstructive pulmonary disease) ([place])   . Costochondritis   . Fibromyalgia   . GERD (gastroesophageal reflux disease)   . Lumbosacral spondylosis without myelopathy   . Migraine   . Mitral valve prolapse   . MRSA (methicillin resistant staph aureus) culture positive   . Myalgia and myositis, unspecified   . Palpitations   . Paroxysmal supraventricular tachycardia ([place])   . SVT (supraventricular tachycardia) ([place])    very brief runs  . Tobacco abuse     Past Surgical History:  Procedure Laterality Date  . CHOLECYSTECTOMY    . INCISION AND DRAINAGE [place] SURGERY  x 2  . TRIGGER FINGER RELEASE    . WISDOM TOOTH EXTRACTION      Family History  Problem Relation Age of Onset  . Hyperlipidemia Mother   . Hypertension Mother   . Hyperlipidemia Father   . Hypertension Father   . Diabetes Father   . Hypertension Brother   . Hyperlipidemia Brother     Social History   Social History  . Marital status: Divorced    Spouse name: N/A  . Number of children: N/A  . Years of education: N/A   Occupational History  . Not on file.   Social History Main Topics  . Smoking status: Current Every Day Smoker    Packs/day: 1.50    Types: E-cigarettes  . Smokeless tobacco: Never Used     Comment: ONLY USING VAPE   . Alcohol use No  . Drug use: No  . Sexual activity: Not on file     Comment: >10 years   Other Topics Concern  . Not on file   Social History Narrative  . No narrative on file    History  Smoking Status  . Current Every Day Smoker  . Packs/day: 1.50  . Types: E-cigarettes  Smokeless Tobacco  . Never Used    Comment: ONLY USING VAPE     History  Alcohol Use No     Allergies    Allergen Reactions  . Carafate [Sucralfate] Hives and Other (See Comments)    CHEST PAIN    . Cefaclor Hives and Other (See Comments)    CHEST PAIN    . Depakote [Divalproex Sodium] Hives and Other (See Comments)    CHEST PAIN    . Nitrofurantoin Hives    CHEST PAIN   . Sulfamethoxazole Hives    CHEST PAIN    . Wellbutrin [Bupropion] Hives and Other (See Comments)    CHEST PAIN     Current Outpatient Prescriptions  Medication Sig Dispense Refill  . Ascorbic Acid (VITAMIN C) 1000 MG tablet Take 1,000 mg by mouth 2 (two) times daily.    Marland Kitchen aspirin EC 81 MG tablet Take 81 mg by mouth daily.    . Calcium Carbonate-Vit D-Min (CALCIUM 1200 PO) Take 1,200 mg by mouth 2 (two) times daily.    . cetirizine (ZYRTEC) 10 MG tablet Take 10 mg by mouth daily as needed for allergies.    Marland Kitchen dexlansoprazole (DEXILANT) 60 MG capsule Take 60 mg by mouth daily.    . diphenhydrAMINE (BENADRYL) 25 MG tablet Take 25 mg by mouth every 6 (six) hours as needed.    . docusate sodium (COLACE) 100 MG capsule Take 100 mg by mouth 2 (two) times daily as needed for constipation.    Marland Kitchen FIORICET 50-300-40 MG CAPS 1 to 2 every 4-6 hrs as needed 60 capsule 0  . fluticasone (FLONASE) 50 MCG/ACT nasal spray Place 2 sprays into both nostrils daily.    . Ginger, Zingiber officinalis, (GINGER ROOT) 550 MG CAPS Take 1-2 capsules by mouth 3 times/day as needed-between meals & bedtime.    Marland Kitchen loratadine (CLARITIN) 10 MG tablet Take 10 mg by mouth daily as needed for allergies.    . medroxyPROGESTERone (PROVERA) 5 MG tablet Take 2.5-5 mg by mouth daily.    . methocarbamol (ROBAXIN) 750 MG tablet Take 750 mg by mouth 4 (four) times daily.    . Multiple Vitamin (MULTIVITAMIN WITH MINERALS) TABS Take 1 tablet by mouth daily.    Marland Kitchen oxyCODONE-acetaminophen (PERCOCET) 7.5-325 MG per  tablet Take 1 tablet by mouth every 4 (four) hours as needed for pain (1-3 tabs prn daily).     . POLYETHYLENE GLYCOL 3350 PO Take by mouth daily.    .  pregabalin (LYRICA) 150 MG capsule Take 150 mg by mouth 2 (two) times daily.    Marland Kitchen pyridoxine (B-6) 100 MG tablet Take 100 mg by mouth 2 (two) times daily.    . ranitidine (ZANTAC) 150 MG tablet Take 150 mg by mouth 3 times/day as needed-between meals & bedtime for heartburn.    . Tiotropium Bromide-Olodaterol (STIOLTO RESPIMAT) 2.5-2.5 MCG/ACT AERS Inhale 2 puffs into the lungs daily. 1 Inhaler 0  . TURMERIC PO Take by mouth 3 (three) times daily.    Marland Kitchen venlafaxine (EFFEXOR) 75 MG tablet Take 75 mg by mouth 3 (three) times daily with meals.    Marland Kitchen zolpidem (AMBIEN) 10 MG tablet Take 10 mg by mouth at bedtime as needed for sleep.     No current facility-administered medications for this visit.       Review of Systems:     Cardiac Review of Systems: Y or N  Chest Pain [ y   ]  Resting SOB [  y ] Exertional SOB  [ y ]  Vertell Limber Blue.Reese  ]   Pedal Edema [ n  ]    Palpitations [ n ] Syncope  [ n ]   Presyncope [  n ]  General Review of Systems: [Y] = yes [  ]=no Constitional: recent weight change [ n ];  Wt loss over the last 3 months [   ] anorexia [  ]; fatigue [ y ]; nausea [  ]; night sweats [  ]; fever [  ]; or chills [  ];          Dental: poor dentition[  ]; Last Dentist visit:   Eye : blurred vision [  ]; diplopia [   ]; vision changes [  ];  Amaurosis fugax[  ]; Resp: cough [ y ];  wheezing[y  ];  hemoptysis[ n ]; shortness of breath[y  ]; paroxysmal nocturnal dyspnea[y  ]; dyspnea on exertion[ y ]; or orthopnea[  ];  GI:  gallstones[  ], vomiting[  ];  dysphagia[  ]; melena[  ];  hematochezia [  ]; heartburn[  ];   Hx of  Colonoscopy[  ]; GU: kidney stones [  ]; hematuria[  ];   dysuria [  ];  nocturia[  ];  history of     obstruction [  ]; urinary frequency [  ]             Skin: rash, swelling[  ];, hair loss[  ];  peripheral edema[  ];  or itching[  ]; Musculosketetal: myalgias[ y ];  joint swelling[ y ];  joint erythema[  ];  joint pain[y  ];  back pain[ y ];  Heme/Lymph: bruising[  ];   bleeding[  ];  anemia[  ];  Neuro: TIA[n  ];  headaches[  ];  stroke[  ];  vertigo[  ];  seizures[  ];   paresthesias[  ];  difficulty walking[  n];  Psych:depression[  ]; anxiety[  ];  Endocrine: diabetes[  ];  thyroid dysfunction[  ];  Immunizations: Flu up to date Blue.Reese  ]; Pneumococcal up to date [ y ];  Other:  Physical Exam: BP 111/70   Pulse 80   Resp 16   Ht '5\' 7"'$  (1.702 m)  Wt 163 lb (73.9 kg)   LMP 09/07/2006 (Within Years) Comment: >10 years  SpO2 98% Comment: ON RA  BMI 25.53 kg/m   PHYSICAL EXAMINATION: General appearance: alert and cooperative Head: Normocephalic, without obvious abnormality, atraumatic Neck: no adenopathy, no carotid bruit, no JVD, supple, symmetrical, trachea midline and thyroid not enlarged, symmetric, no tenderness/mass/nodules Lymph nodes: Cervical, supraclavicular, and axillary nodes normal. Resp: clear to auscultation bilaterally Back: symmetric, no curvature. ROM normal. No CVA tenderness. Cardio: regular rate and rhythm, S1, S2 normal, no murmur, click, rub or gallop GI: soft, non-tender; bowel sounds normal; no masses,  no organomegaly Extremities: extremities normal, atraumatic, no cyanosis or edema Neurologic: Grossly normal  Full dp and pt pulses bilateral   Diagnostic Studies & Laboratory data:     Recent Radiology Findings:   Ct Chest Wo Contrast  Result Date: 09/04/2016 CLINICAL DATA:  Chest pain and shortness of breath off and on for several months EXAM: CT CHEST WITHOUT CONTRAST TECHNIQUE: Multidetector CT imaging of the chest was performed following the standard protocol without IV contrast. COMPARISON:  07/27/2016 FINDINGS: Cardiovascular: Somewhat limited due the lack of IV contrast. Aortic calcifications are noted without aneurysmal dilatation. Coronary calcifications are seen as well. Mediastinum/Nodes: The thoracic inlet is within normal limits. Few scattered small AP window nodes are seen. The largest of these measures  approximately 8 mm in short axis. No definitive hilar adenopathy is noted. The esophagus as visualize is within normal limits. Lungs/Pleura: Emphysematous changes are noted bilaterally. The lungs are well aerated without focal infiltrate or sizable effusion. In the left upper lobe there is a spiculated mass lesion which measures approximately 1.7 cm in greatest dimension on axial imaging consistent with primary pulmonary neoplasm. On the coronal imaging the lesion measures approximately 2.7 cm and has some linear density extending superiorly to the pleural margin. This may represent some localized extension. No other nodules are identified. Upper Abdomen: Within normal limits. Musculoskeletal: No chest wall mass or suspicious bone lesions identified. IMPRESSION: Spiculated mass lesion in the left upper lobe as described above consistent with primary pulmonary neoplasm. Few scattered AP window nodes are seen. PET-CT is recommended for further evaluation These results will be called to the ordering clinician or representative by the Radiologist Assistant, and communication documented in the PACS or zVision Dashboard. Electronically Signed   By: Inez Catalina M.D.   On: 09/04/2016 11:38   Nm Pet Image Initial (pi) Skull Base To Thigh  Result Date: 09/08/2016 CLINICAL DATA:  Initial treatment strategy for left upper lobe lung nodule. EXAM: NUCLEAR MEDICINE PET SKULL BASE TO THIGH TECHNIQUE: 1214 mCi F-18 FDG was injected intravenously. Full-ring PET imaging was performed from the skull base to thigh after the radiotracer. CT data was obtained and used for attenuation correction and anatomic localization. FASTING BLOOD GLUCOSE:  Value: 74 mg/dl COMPARISON:  Chest CT 09/04/2016. FINDINGS: NECK Mildly hypermetabolic bilateral level 2 nodes. A left-sided node measures 9 mm and a S.U.V. max of 3.1 on image 24/series 4. A right-sided node measures 8 mm and a S.U.V. max of 4.1 on image 25/series 4. CHEST Hypermetabolism  corresponding to the left apical lung nodule. This measures 1.7 cm and a S.U.V. max of 9.1 on image 59/series 4. No thoracic nodal hypermetabolism. ABDOMEN/PELVIS No areas of abnormal hypermetabolism. SKELETON Right Shoulder muscular activity is likely due to min after radiopharmaceutical injection. There is focal activity involving the left facet at C3-4, favored to be degenerative. This measures a S.U.V. max of 4.1, including  on image 24/series 4. CT IMAGES PERFORMED FOR ATTENUATION CORRECTION Bilateral carotid atherosclerosis. Aortic and branch vessel atherosclerosis. Borderline cardiomegaly. Multivessel coronary artery atherosclerosis. Mild centrilobular emphysema. Other chest findings deferred to recent diagnostic CT. No acute superimposed process. Normal adrenal glands. Cholecystectomy. Abdominal aortic atherosclerosis. Intrauterine device. IMPRESSION: 1. Left apical spiculated hypermetabolic pulmonary nodule, consistent with primary bronchogenic carcinoma. 2. No thoracic nodal metastasis. 3. Bilateral level 2 cervical nodes with mild hypermetabolism. Suspicious for but not diagnostic of a somewhat unusual initial distribution of nodal metastasis from primary bronchogenic carcinoma. As reactive activity could look similar, consider sampling. 4. Age advanced coronary artery atherosclerosis. Recommend assessment of coronary risk factors and consideration of medical therapy. 5.  Aortic atherosclerosis. Electronically Signed   By: Abigail Miyamoto M.D.   On: 09/08/2016 14:47     I have independently reviewed the above radiologic studies.  Recent Lab Findings: Lab Results  Component Value Date   WBC 8.4 09/21/2016   HGB 13.6 09/21/2016   HCT 40.5 09/21/2016   PLT 238 09/21/2016   GLUCOSE 100 (H) 09/21/2016   CHOL 182 04/09/2007   TRIG 68 04/09/2007   HDL 51 04/09/2007   LDLCALC 117 04/09/2007   ALT 51 (H) 10/08/2010   AST 64 (H) 10/08/2010   NA 139 09/21/2016   K 3.5 09/21/2016   CL 106 09/21/2016     CREATININE 0.78 09/21/2016   BUN 9 09/21/2016   CO2 26 09/21/2016   INR 0.97 09/21/2016   Diagnosis Lymph node, needle/core biopsy, Left Cervical LN - THERE IS NO EVIDENCE OF CARCINOMA IN 1 OF 1 LYMPH NODE (0/1). JOSHUA KISH MD  PFT's  FEV1 2.05 70% DLCO 18.52 65% Interpretation: The FEV1, FEV1/FVC ratio and FEF25-75% are reduced indicating airway obstruction. The normal airway resistance and decreased specific conductance indicate a peripheral or small airway disease. While the TLC, FRC and SVC are within normal limits, the RV is increased. Following administration of bronchodilators, there is no significant response. The reduced diffusing capacity indicates a mild loss of functional alveolar capillary surface. However, the diffusing capacity was not corrected for the patient's hemoglobin. Conclusions: Although there is airway obstruction and a diffusion defect suggesting emphysema, the absence of overinflation is inconsistent with that diagnosis.  Assessment / Plan:     Left upper lobe mass suggestive of primary lung cancer Pulmonary Function Diagnosis: Mild Obstructive Airways Disease Mild Diffusion Defect  I have recommend to patient to proceed with bronchoscopy , left vats and lung resection with node excesion. Risks and options discussed in detail. Patient agreeable with proceeding  Nov 13   [name] MD      [address] ,[place] [address] Office [phone]   Beeper [phone]  10/05/2016 3:25 PM

## 2016-10-05 NOTE — Telephone Encounter (Signed)
Notes Recorded by Dionne Bucy Truitt, CMA on 10/05/2016 at 2:59 PM EDT Left detailed voice message.

## 2016-10-05 NOTE — Telephone Encounter (Signed)
FORWARD TO [place]

## 2016-10-05 NOTE — Telephone Encounter (Signed)
New message  Pt returning call to "shelly"  Pt of Dr. Sallyanne Kuster  Pt is waiting

## 2016-10-16 ENCOUNTER — Encounter (HOSPITAL_COMMUNITY)
Admission: RE | Admit: 2016-10-16 | Discharge: 2016-10-16 | Disposition: A | Discharge status: Home / Self Care | Payer: Medicare Other | Source: Ambulatory Visit | Attending: Cardiothoracic Surgery | Admitting: Cardiothoracic Surgery

## 2016-10-16 ENCOUNTER — Encounter (HOSPITAL_COMMUNITY): Payer: Self-pay | Admitting: Anesthesiology

## 2016-10-16 ENCOUNTER — Encounter (HOSPITAL_COMMUNITY): Payer: Self-pay

## 2016-10-16 DIAGNOSIS — Z01812 Encounter for preprocedural laboratory examination: Secondary | ICD-10-CM | POA: Insufficient documentation

## 2016-10-16 DIAGNOSIS — K219 Gastro-esophageal reflux disease without esophagitis: Secondary | ICD-10-CM | POA: Diagnosis not present

## 2016-10-16 DIAGNOSIS — Z01818 Encounter for other preprocedural examination: Secondary | ICD-10-CM | POA: Insufficient documentation

## 2016-10-16 DIAGNOSIS — C3412 Malignant neoplasm of upper lobe, left bronchus or lung: Secondary | ICD-10-CM | POA: Diagnosis not present

## 2016-10-16 DIAGNOSIS — Z91048 Other nonmedicinal substance allergy status: Secondary | ICD-10-CM | POA: Diagnosis not present

## 2016-10-16 DIAGNOSIS — Z833 Family history of diabetes mellitus: Secondary | ICD-10-CM | POA: Diagnosis not present

## 2016-10-16 DIAGNOSIS — J449 Chronic obstructive pulmonary disease, unspecified: Secondary | ICD-10-CM | POA: Diagnosis not present

## 2016-10-16 DIAGNOSIS — Z8249 Family history of ischemic heart disease and other diseases of the circulatory system: Secondary | ICD-10-CM | POA: Diagnosis not present

## 2016-10-16 DIAGNOSIS — R911 Solitary pulmonary nodule: Secondary | ICD-10-CM

## 2016-10-16 DIAGNOSIS — Z9889 Other specified postprocedural states: Secondary | ICD-10-CM | POA: Diagnosis not present

## 2016-10-16 DIAGNOSIS — G8929 Other chronic pain: Secondary | ICD-10-CM | POA: Diagnosis not present

## 2016-10-16 DIAGNOSIS — Z9049 Acquired absence of other specified parts of digestive tract: Secondary | ICD-10-CM | POA: Diagnosis not present

## 2016-10-16 DIAGNOSIS — M797 Fibromyalgia: Secondary | ICD-10-CM | POA: Diagnosis not present

## 2016-10-16 HISTORY — DX: Pneumonia, unspecified organism: J18.9

## 2016-10-16 HISTORY — DX: Carpal tunnel syndrome, bilateral upper limbs: G56.03

## 2016-10-16 HISTORY — DX: Methicillin resistant Staphylococcus aureus infection, unspecified site: A49.02

## 2016-10-16 HISTORY — DX: Depression, unspecified: F32.A

## 2016-10-16 HISTORY — DX: Cardiac murmur, unspecified: R01.1

## 2016-10-16 HISTORY — DX: Major depressive disorder, single episode, unspecified: F32.9

## 2016-10-16 LAB — CBC
HCT: 38.7 % (ref 36.0–46.0)
Hemoglobin: 12.9 g/dL (ref 12.0–15.0)
MCH: 31.2 pg (ref 26.0–34.0)
MCHC: 33.3 g/dL (ref 30.0–36.0)
MCV: 93.5 fL (ref 78.0–100.0)
Platelets: 202 10*3/uL (ref 150–400)
RBC: 4.14 MIL/uL (ref 3.87–5.11)
RDW: 12.8 % (ref 11.5–15.5)
WBC: 6.5 10*3/uL (ref 4.0–10.5)

## 2016-10-16 LAB — URINALYSIS, ROUTINE W REFLEX MICROSCOPIC
Bilirubin Urine: NEGATIVE
Glucose, UA: NEGATIVE mg/dL
Hgb urine dipstick: NEGATIVE
Ketones, ur: NEGATIVE mg/dL
Leukocytes, UA: NEGATIVE
Nitrite: NEGATIVE
Protein, ur: NEGATIVE mg/dL
Specific Gravity, Urine: 1.031 — ABNORMAL HIGH (ref 1.005–1.030)
pH: 5 (ref 5.0–8.0)

## 2016-10-16 LAB — TYPE AND SCREEN
ABO/RH(D): A POS
Antibody Screen: NEGATIVE

## 2016-10-16 LAB — COMPREHENSIVE METABOLIC PANEL
ALT: 13 U/L — ABNORMAL LOW (ref 14–54)
AST: 17 U/L (ref 15–41)
Albumin: 3.9 g/dL (ref 3.5–5.0)
Alkaline Phosphatase: 78 U/L (ref 38–126)
Anion gap: 8 (ref 5–15)
BUN: 6 mg/dL (ref 6–20)
CO2: 20 mmol/L — ABNORMAL LOW (ref 22–32)
Calcium: 9.2 mg/dL (ref 8.9–10.3)
Chloride: 111 mmol/L (ref 101–111)
Creatinine, Ser: 0.75 mg/dL (ref 0.44–1.00)
GFR calc Af Amer: >60 mL/min (ref >60)
GFR calc non Af Amer: >60 mL/min (ref >60)
Glucose, Bld: 98 mg/dL (ref 65–99)
Potassium: 3.8 mmol/L (ref 3.5–5.1)
Sodium: 139 mmol/L (ref 135–145)
Total Bilirubin: 0.5 mg/dL (ref 0.3–1.2)
Total Protein: 6.6 g/dL (ref 6.5–8.1)

## 2016-10-16 LAB — BLOOD GAS, ARTERIAL
Acid-base deficit: 1.7 mmol/L (ref 0.0–2.0)
Bicarbonate: 22.1 mmol/L (ref 20.0–28.0)
Drawn by: 421801
FIO2: 21
O2 Saturation: 97.2 %
Patient temperature: 98.6
pCO2 arterial: 34.2 mmHg (ref 32.0–48.0)
pH, Arterial: 7.425 (ref 7.350–7.450)
pO2, Arterial: 91.5 mmHg (ref 83.0–108.0)

## 2016-10-16 LAB — PROTIME-INR
INR: 1.05
Prothrombin Time: 13.7 seconds (ref 11.4–15.2)

## 2016-10-16 LAB — APTT: aPTT: 31 seconds (ref 24–36)

## 2016-10-16 LAB — SURGICAL PCR SCREEN
MRSA, PCR: NEGATIVE
STAPHYLOCOCCUS AUREUS: NEGATIVE

## 2016-10-16 LAB — ABO/RH: ABO/RH(D): A POS

## 2016-10-16 NOTE — Anesthesia Preprocedure Evaluation (Addendum)
Anesthesia Evaluation  Patient identified by MRN, date of birth, ID band Patient awake    Reviewed: Allergy & Precautions, NPO status , Patient's Chart, lab work & pertinent test results  Airway Mallampati: I  TM Distance: >3 FB Neck ROM: Full    Dental  (+) Dental Advisory Given, Chipped,    Pulmonary COPD, Current Smoker,     + decreased breath sounds      Cardiovascular + CAD and + Peripheral Vascular Disease  + dysrhythmias Supra Ventricular Tachycardia + Valvular Problems/Murmurs MVP  Rhythm:Regular Rate:Normal     Neuro/Psych  Headaches, PSYCHIATRIC DISORDERS Depression  Neuromuscular disease    GI/Hepatic Neg liver ROS, GERD  Medicated,  Endo/Other  negative endocrine ROS  Renal/GU negative Renal ROS  negative genitourinary   Musculoskeletal  (+) Arthritis , Fibromyalgia -  Abdominal   Peds negative pediatric ROS (+)  Hematology negative hematology ROS (+)   Anesthesia Other Findings   Reproductive/Obstetrics                           Lab Results  Component Value Date   WBC 6.5 10/16/2016   HGB 12.9 10/16/2016   HCT 38.7 10/16/2016   MCV 93.5 10/16/2016   PLT 202 10/16/2016   Lab Results  Component Value Date   CREATININE 0.75 10/16/2016   BUN 6 10/16/2016   NA 139 10/16/2016   K 3.8 10/16/2016   CL 111 10/16/2016   CO2 20 (L) 10/16/2016   Lab Results  Component Value Date   INR 1.05 10/16/2016   INR 0.97 09/21/2016    EKG: NSR  Anesthesia Physical Anesthesia Plan  ASA: III  Anesthesia Plan: General   Post-op Pain Management:    Induction: Intravenous  Airway Management Planned: Double Lumen EBT  Additional Equipment: Arterial line, CVP and Ultrasound Guidance Line Placement  Intra-op Plan:   Post-operative Plan: Extubation in OR  Informed Consent: I have reviewed the patients History and Physical, chart, labs and discussed the procedure including the  risks, benefits and alternatives for the proposed anesthesia with the patient or authorized representative who has indicated his/her understanding and acceptance.   Dental advisory given  Plan Discussed with: CRNA  Anesthesia Plan Comments:         Anesthesia Quick Evaluation

## 2016-10-16 NOTE — Pre-Procedure Instructions (Signed)
    VENEZIA SARGEANT  10/16/2016      [facility] - [place],  - [address] [place] [address] Phone: [phone] Fax: [phone]    Your procedure is scheduled on Tuesday, November 14th   Report to [facility] Admitting at 5:30 AM             (posted surgery time 7:30 AM - 11:47 AM)   Call this number if you have problems the MORNING of surgery:  [phone]   Remember:  Do not eat food or drink liquids after midnight tonight.  Take these medicines the morning of surgery with A SIP OF WATER : Zyrtec, Dexilant, Effexor, Pain medication.  Please use your inhaler in the morning.   Do not wear jewelry, make-up or nail polish.  Do not wear lotions, powders, perfumes, or deoderant.   Do not shave underarms & leg 48 hours prior to surgery.    Do not bring valuables to the hospital.  [facility] is not responsible for any belongings or valuables.  Contacts, dentures or bridgework may not be worn into surgery.  Leave your suitcase in the car.  After surgery it may be brought to your room.  For patients admitted to the hospital, discharge time will be determined by your treatment team.  Please read over the following fact sheets that you were given. Pain Booklet, MRSA Information and Surgical Site Infection Prevention

## 2016-10-16 NOTE — Progress Notes (Signed)
Anesthesia Chart Review: Patient is a 60 year old female scheduled for video bronchoscopy, left VATS/lung resection on 10/17/16 by Dr. Servando Snare.  History includes smoking, palpitations, SVT, COPD, fibromyalgia, carotid stenosis (1-49% 02/2015), GERD, ADD, MVP, MRSA, depression, migraines, cholecystectomy, left L5-S1 microdiscectomy '10.   PCP is Dr. Deland Pretty. Pulmonologist is Dr. Simonne Maffucci. She was referred to cardiologist Dr. Sallyanne Kuster for pre-operative evaluation. She had history of chest pain with coronary calcifications on chest CT. Stress test was low risk, so no additional cardiac work-up recommended prior to lung surgery.  Meds include aspirin 81 mg, Zyrtec, Dexilant, Estrace, Provera, Robaxin, Lyrica, Zantac, Stiolto, Fioricet, Estrace, Effexor, Ambien, Turmeric.  BP 110/66   Pulse 82   Temp 37.2 C   Resp 18   Ht '5\' 7"'$  (1.702 m)   Wt 161 lb (73 kg)   LMP 09/07/2006 (Within Years) Comment: >10 years  SpO2 100%   BMI 25.22 kg/m   10/02/16 EKG: NSR, non-specific ST abnormality.  10/04/16 Nuclear stress test:  Nuclear stress EF: 68%. No wall motion abnormalities  There was no ST segment deviation noted during stress.  Defect 1: There is a small defect of mild severity present in the apex location. May be related to breast attenuation artifact.  This is a low risk study. No ischemia identified.  Remote history of echo in 1997 (no gross MVP, trace MR/TR).  07/17/12-07/30/12 Cardionet: No true afib. Rare brief atrial tachycardia.  02/23/15 Carotid U/S: Summary: Right bulb and ICA 1-49% diameter reduction. Left bulla proximal ICA 1-49% diameter reduction. Right left mid and distal ICAs demonstrate moderate tortuosity of the vessels.  10/16/16 CXR: IMPRESSION: 1. No active cardiopulmonary disease. 2. Known left apical nodule.  09/25/16 PFTs: FVC 3.10 82%  FEV1 2.05 70% DLCO 18.52 65% Interpretation: The FEV1, FEV1/FVC ratio and FEF25-75% are reduced indicating  airway obstruction. The normal airway resistance and decreased specific conductance indicate a peripheral or small airway disease. While the TLC, FRC and SVC are within normal limits, the RV is increased. Following administration of bronchodilators, there is no significant response. The reduced diffusing capacity indicates a mild loss of functional alveolar capillary surface. However, the diffusing capacity was not corrected for the patient's hemoglobin. Conclusions: Although there is airway obstruction and a diffusion defect suggesting emphysema, the absence of overinflation is inconsistent with that diagnosis.  Preoperative labs noted.   If no acute changes then I anticipate that she can proceed as planned.  George [facility] Short Stay Center/Anesthesiology Phone [phone] 10/16/2016 2:56 PM

## 2016-10-16 NOTE — Progress Notes (Signed)
PCP is Dr. Audie Pinto Cardio is Dr. Recardo Evangelist   LOV 09/2016 Echo in 06/1996 Stress test 10/2016

## 2016-10-17 ENCOUNTER — Inpatient Hospital Stay (HOSPITAL_COMMUNITY): Payer: Medicare Other

## 2016-10-17 ENCOUNTER — Encounter (HOSPITAL_COMMUNITY)
Admission: RE | Disposition: A | Discharge status: Home / Self Care | Payer: Self-pay | Source: Ambulatory Visit | Attending: Cardiothoracic Surgery

## 2016-10-17 ENCOUNTER — Inpatient Hospital Stay (HOSPITAL_COMMUNITY): Payer: Medicare Other | Admitting: Certified Registered Nurse Anesthetist

## 2016-10-17 ENCOUNTER — Inpatient Hospital Stay (HOSPITAL_COMMUNITY)
Admission: RE | Admit: 2016-10-17 | Discharge: 2016-10-21 | DRG: 165 | Disposition: A | Discharge status: Home / Self Care | Payer: Medicare Other | Source: Ambulatory Visit | Attending: Cardiothoracic Surgery | Admitting: Cardiothoracic Surgery

## 2016-10-17 ENCOUNTER — Encounter (HOSPITAL_COMMUNITY): Payer: Self-pay | Admitting: Urology

## 2016-10-17 DIAGNOSIS — K219 Gastro-esophageal reflux disease without esophagitis: Secondary | ICD-10-CM | POA: Diagnosis present

## 2016-10-17 DIAGNOSIS — J60 Coalworker's pneumoconiosis: Secondary | ICD-10-CM | POA: Diagnosis not present

## 2016-10-17 DIAGNOSIS — F17210 Nicotine dependence, cigarettes, uncomplicated: Secondary | ICD-10-CM | POA: Diagnosis present

## 2016-10-17 DIAGNOSIS — R0989 Other specified symptoms and signs involving the circulatory and respiratory systems: Secondary | ICD-10-CM | POA: Diagnosis not present

## 2016-10-17 DIAGNOSIS — Z5332 Thoracoscopic surgical procedure converted to open procedure: Secondary | ICD-10-CM | POA: Diagnosis not present

## 2016-10-17 DIAGNOSIS — J939 Pneumothorax, unspecified: Secondary | ICD-10-CM | POA: Diagnosis not present

## 2016-10-17 DIAGNOSIS — J449 Chronic obstructive pulmonary disease, unspecified: Secondary | ICD-10-CM | POA: Diagnosis present

## 2016-10-17 DIAGNOSIS — Z833 Family history of diabetes mellitus: Secondary | ICD-10-CM | POA: Diagnosis not present

## 2016-10-17 DIAGNOSIS — C3412 Malignant neoplasm of upper lobe, left bronchus or lung: Principal | ICD-10-CM | POA: Diagnosis present

## 2016-10-17 DIAGNOSIS — Z8249 Family history of ischemic heart disease and other diseases of the circulatory system: Secondary | ICD-10-CM | POA: Diagnosis not present

## 2016-10-17 DIAGNOSIS — Z902 Acquired absence of lung [part of]: Secondary | ICD-10-CM

## 2016-10-17 DIAGNOSIS — G8929 Other chronic pain: Secondary | ICD-10-CM | POA: Diagnosis present

## 2016-10-17 DIAGNOSIS — Z9889 Other specified postprocedural states: Secondary | ICD-10-CM

## 2016-10-17 DIAGNOSIS — Z4682 Encounter for fitting and adjustment of non-vascular catheter: Secondary | ICD-10-CM | POA: Diagnosis not present

## 2016-10-17 DIAGNOSIS — R911 Solitary pulmonary nodule: Secondary | ICD-10-CM | POA: Diagnosis not present

## 2016-10-17 DIAGNOSIS — K5900 Constipation, unspecified: Secondary | ICD-10-CM | POA: Diagnosis not present

## 2016-10-17 DIAGNOSIS — J984 Other disorders of lung: Secondary | ICD-10-CM | POA: Diagnosis not present

## 2016-10-17 DIAGNOSIS — Z9689 Presence of other specified functional implants: Secondary | ICD-10-CM

## 2016-10-17 DIAGNOSIS — Z9049 Acquired absence of other specified parts of digestive tract: Secondary | ICD-10-CM | POA: Diagnosis not present

## 2016-10-17 DIAGNOSIS — Z09 Encounter for follow-up examination after completed treatment for conditions other than malignant neoplasm: Secondary | ICD-10-CM

## 2016-10-17 DIAGNOSIS — Z91048 Other nonmedicinal substance allergy status: Secondary | ICD-10-CM | POA: Diagnosis not present

## 2016-10-17 DIAGNOSIS — M797 Fibromyalgia: Secondary | ICD-10-CM | POA: Diagnosis not present

## 2016-10-17 DIAGNOSIS — R079 Chest pain, unspecified: Secondary | ICD-10-CM | POA: Diagnosis not present

## 2016-10-17 HISTORY — PX: VIDEO ASSISTED THORACOSCOPY (VATS)/WEDGE RESECTION: SHX6174

## 2016-10-17 HISTORY — DX: Malignant neoplasm of upper lobe, unspecified bronchus or lung: C34.10

## 2016-10-17 HISTORY — PX: VIDEO BRONCHOSCOPY: SHX5072

## 2016-10-17 LAB — POCT I-STAT 4, (NA,K, GLUC, HGB,HCT)
Glucose, Bld: 128 mg/dL — ABNORMAL HIGH (ref 65–99)
HCT: 40 % (ref 36.0–46.0)
Hemoglobin: 13.6 g/dL (ref 12.0–15.0)
Potassium: 3.8 mmol/L (ref 3.5–5.1)
Sodium: 141 mmol/L (ref 135–145)

## 2016-10-17 LAB — POCT I-STAT 3, ART BLOOD GAS (G3+)
Acid-base deficit: 4 mmol/L — ABNORMAL HIGH (ref 0.0–2.0)
Bicarbonate: 24.4 mmol/L (ref 20.0–28.0)
O2 Saturation: 86 %
Patient temperature: 98
TCO2: 26 mmol/L (ref 0–100)
pCO2 arterial: 57.6 mmHg — ABNORMAL HIGH (ref 32.0–48.0)
pH, Arterial: 7.233 — ABNORMAL LOW (ref 7.350–7.450)
pO2, Arterial: 61 mmHg — ABNORMAL LOW (ref 83.0–108.0)

## 2016-10-17 SURGERY — BRONCHOSCOPY, VIDEO-ASSISTED
Anesthesia: General | Site: Chest

## 2016-10-17 MED ORDER — ROCURONIUM BROMIDE 10 MG/ML (PF) SYRINGE
PREFILLED_SYRINGE | INTRAVENOUS | Status: AC
  Filled 2016-10-17: qty 10

## 2016-10-17 MED ORDER — METOCLOPRAMIDE HCL 5 MG/ML IJ SOLN
10.0000 mg | Freq: Four times a day (QID) | INTRAMUSCULAR | Status: AC
  Administered 2016-10-17 – 2016-10-18 (×4): 10 mg via INTRAVENOUS
  Filled 2016-10-17 (×4): qty 2

## 2016-10-17 MED ORDER — ONDANSETRON HCL 4 MG/2ML IJ SOLN
4.0000 mg | Freq: Four times a day (QID) | INTRAMUSCULAR | Status: DC | PRN
Start: 2016-10-17 — End: 2016-10-19

## 2016-10-17 MED ORDER — ONDANSETRON HCL 4 MG/2ML IJ SOLN
INTRAMUSCULAR | Status: DC | PRN
  Administered 2016-10-17: 4 mg via INTRAVENOUS

## 2016-10-17 MED ORDER — BUPIVACAINE HCL (PF) 0.5 % IJ SOLN
INTRAMUSCULAR | Status: AC
Start: 2016-10-17 — End: 2016-10-17
  Filled 2016-10-17: qty 10

## 2016-10-17 MED ORDER — DEXAMETHASONE SODIUM PHOSPHATE 10 MG/ML IJ SOLN
INTRAMUSCULAR | Status: AC
  Filled 2016-10-17: qty 1

## 2016-10-17 MED ORDER — ADULT MULTIVITAMIN W/MINERALS CH
1.0000 | ORAL_TABLET | Freq: Every day | ORAL | Status: DC
  Administered 2016-10-18 – 2016-10-21 (×4): 1 via ORAL
  Filled 2016-10-17 (×4): qty 1

## 2016-10-17 MED ORDER — KETAMINE HCL-SODIUM CHLORIDE 100-0.9 MG/10ML-% IV SOSY
PREFILLED_SYRINGE | INTRAVENOUS | Status: AC
  Filled 2016-10-17: qty 10

## 2016-10-17 MED ORDER — ASPIRIN EC 81 MG PO TBEC
81.0000 mg | DELAYED_RELEASE_TABLET | Freq: Every day | ORAL | Status: DC
  Administered 2016-10-18 – 2016-10-21 (×4): 81 mg via ORAL
  Filled 2016-10-17 (×4): qty 1

## 2016-10-17 MED ORDER — FENTANYL 40 MCG/ML IV SOLN
INTRAVENOUS | Status: AC
  Filled 2016-10-17: qty 25

## 2016-10-17 MED ORDER — HYDROMORPHONE HCL 1 MG/ML IJ SOLN: 0.2500 mg | INTRAMUSCULAR | Status: DC | PRN

## 2016-10-17 MED ORDER — FENTANYL CITRATE (PF) 100 MCG/2ML IJ SOLN
INTRAMUSCULAR | Status: DC | PRN
  Administered 2016-10-17 (×9): 50 ug via INTRAVENOUS
  Administered 2016-10-17: 100 ug via INTRAVENOUS
  Administered 2016-10-17 (×2): 50 ug via INTRAVENOUS

## 2016-10-17 MED ORDER — ONDANSETRON HCL 4 MG/2ML IJ SOLN
INTRAMUSCULAR | Status: AC
  Filled 2016-10-17: qty 2

## 2016-10-17 MED ORDER — HYDROMORPHONE HCL 1 MG/ML IJ SOLN
0.2500 mg | INTRAMUSCULAR | Status: DC | PRN
  Administered 2016-10-17 (×4): 0.5 mg via INTRAVENOUS

## 2016-10-17 MED ORDER — DIPHENHYDRAMINE HCL 50 MG/ML IJ SOLN: 12.5000 mg | Freq: Four times a day (QID) | INTRAMUSCULAR | Status: DC | PRN

## 2016-10-17 MED ORDER — PREGABALIN 75 MG PO CAPS
150.0000 mg | ORAL_CAPSULE | Freq: Two times a day (BID) | ORAL | Status: DC
  Administered 2016-10-17 – 2016-10-21 (×8): 150 mg via ORAL
  Filled 2016-10-17 (×8): qty 2

## 2016-10-17 MED ORDER — VANCOMYCIN HCL IN DEXTROSE 1-5 GM/200ML-% IV SOLN
1000.0000 mg | Freq: Two times a day (BID) | INTRAVENOUS | Status: AC
  Administered 2016-10-17: 1000 mg via INTRAVENOUS
  Filled 2016-10-17: qty 200

## 2016-10-17 MED ORDER — LORATADINE 10 MG PO TABS
10.0000 mg | ORAL_TABLET | Freq: Every day | ORAL | Status: DC
  Administered 2016-10-18 – 2016-10-21 (×4): 10 mg via ORAL
  Filled 2016-10-17 (×5): qty 1

## 2016-10-17 MED ORDER — BUPIVACAINE 0.5 % ON-Q PUMP SINGLE CATH 400 ML
400.0000 mL | INJECTION | Status: DC
  Filled 2016-10-17: qty 400

## 2016-10-17 MED ORDER — BISACODYL 5 MG PO TBEC
10.0000 mg | DELAYED_RELEASE_TABLET | Freq: Every day | ORAL | Status: DC
  Administered 2016-10-18 – 2016-10-19 (×2): 10 mg via ORAL
  Filled 2016-10-17 (×3): qty 2

## 2016-10-17 MED ORDER — MEPERIDINE HCL 25 MG/ML IJ SOLN: 6.2500 mg | INTRAMUSCULAR | Status: DC | PRN

## 2016-10-17 MED ORDER — 0.9 % SODIUM CHLORIDE (POUR BTL) OPTIME
TOPICAL | Status: DC | PRN
  Administered 2016-10-17: 2000 mL

## 2016-10-17 MED ORDER — PROPOFOL 10 MG/ML IV BOLUS
INTRAVENOUS | Status: DC | PRN
Start: 2016-10-17 — End: 2016-10-17
  Administered 2016-10-17: 120 mg via INTRAVENOUS
  Administered 2016-10-17: 30 mg via INTRAVENOUS

## 2016-10-17 MED ORDER — ONDANSETRON HCL 4 MG/2ML IJ SOLN
4.0000 mg | Freq: Four times a day (QID) | INTRAMUSCULAR | Status: DC | PRN
Start: 2016-10-17 — End: 2016-10-21

## 2016-10-17 MED ORDER — ARTIFICIAL TEARS OP OINT
TOPICAL_OINTMENT | OPHTHALMIC | Status: DC | PRN
  Administered 2016-10-17: 1 via OPHTHALMIC

## 2016-10-17 MED ORDER — DIPHENHYDRAMINE HCL 50 MG/ML IJ SOLN
INTRAMUSCULAR | Status: AC
Start: 2016-10-17 — End: 2016-10-17
  Filled 2016-10-17: qty 1

## 2016-10-17 MED ORDER — PROMETHAZINE HCL 25 MG/ML IJ SOLN: 6.2500 mg | INTRAMUSCULAR | Status: DC | PRN

## 2016-10-17 MED ORDER — FENTANYL CITRATE (PF) 250 MCG/5ML IJ SOLN
INTRAMUSCULAR | Status: AC
  Filled 2016-10-17: qty 5

## 2016-10-17 MED ORDER — FENTANYL 40 MCG/ML IV SOLN
INTRAVENOUS | Status: DC
  Administered 2016-10-17: 165 ug via INTRAVENOUS
  Administered 2016-10-17: 12:00:00 via INTRAVENOUS
  Administered 2016-10-17: 165 ug via INTRAVENOUS
  Administered 2016-10-18: 135 ug via INTRAVENOUS
  Administered 2016-10-18: 105 ug via INTRAVENOUS
  Administered 2016-10-18: 90 ug via INTRAVENOUS
  Administered 2016-10-18: 300 ug via INTRAVENOUS
  Administered 2016-10-19: 90 ug via INTRAVENOUS
  Administered 2016-10-19: 75 ug via INTRAVENOUS
  Filled 2016-10-17: qty 25

## 2016-10-17 MED ORDER — ACETAMINOPHEN 160 MG/5ML PO SOLN
1000.0000 mg | Freq: Four times a day (QID) | ORAL | Status: DC
  Administered 2016-10-17: 1000 mg via ORAL
  Filled 2016-10-17: qty 40.6

## 2016-10-17 MED ORDER — FAMOTIDINE 20 MG PO TABS
20.0000 mg | ORAL_TABLET | Freq: Two times a day (BID) | ORAL | Status: DC | PRN
  Administered 2016-10-21: 20 mg via ORAL
  Filled 2016-10-17: qty 1

## 2016-10-17 MED ORDER — OXYCODONE-ACETAMINOPHEN 7.5-325 MG PO TABS
1.0000 | ORAL_TABLET | ORAL | Status: DC | PRN
  Administered 2016-10-17 – 2016-10-21 (×14): 1 via ORAL
  Filled 2016-10-17 (×15): qty 1

## 2016-10-17 MED ORDER — ACETAMINOPHEN 500 MG PO TABS
1000.0000 mg | ORAL_TABLET | Freq: Four times a day (QID) | ORAL | Status: DC
  Administered 2016-10-17 – 2016-10-18 (×3): 1000 mg via ORAL
  Filled 2016-10-17 (×3): qty 2

## 2016-10-17 MED ORDER — ESTRADIOL 1 MG PO TABS
1.0000 mg | ORAL_TABLET | Freq: Every day | ORAL | Status: DC
  Administered 2016-10-18 – 2016-10-21 (×4): 1 mg via ORAL
  Filled 2016-10-17 (×4): qty 1

## 2016-10-17 MED ORDER — ROCURONIUM BROMIDE 100 MG/10ML IV SOLN
INTRAVENOUS | Status: DC | PRN
  Administered 2016-10-17: 5 mg via INTRAVENOUS
  Administered 2016-10-17: 10 mg via INTRAVENOUS
  Administered 2016-10-17: 40 mg via INTRAVENOUS
  Administered 2016-10-17: 60 mg via INTRAVENOUS
  Administered 2016-10-17: 20 mg via INTRAVENOUS
  Administered 2016-10-17: 10 mg via INTRAVENOUS

## 2016-10-17 MED ORDER — SUGAMMADEX SODIUM 200 MG/2ML IV SOLN
INTRAVENOUS | Status: AC
  Filled 2016-10-17: qty 2

## 2016-10-17 MED ORDER — METHOCARBAMOL 500 MG PO TABS
750.0000 mg | ORAL_TABLET | Freq: Four times a day (QID) | ORAL | Status: DC
  Administered 2016-10-17 – 2016-10-21 (×14): 750 mg via ORAL
  Filled 2016-10-17 (×2): qty 1
  Filled 2016-10-17: qty 2
  Filled 2016-10-17 (×7): qty 1
  Filled 2016-10-17: qty 2
  Filled 2016-10-17: qty 1
  Filled 2016-10-17: qty 2
  Filled 2016-10-17 (×5): qty 1

## 2016-10-17 MED ORDER — PHENYLEPHRINE HCL 10 MG/ML IJ SOLN
INTRAVENOUS | Status: DC | PRN
  Administered 2016-10-17 (×2): 20 ug/min via INTRAVENOUS

## 2016-10-17 MED ORDER — DEXAMETHASONE SODIUM PHOSPHATE 10 MG/ML IJ SOLN
INTRAMUSCULAR | Status: DC | PRN
  Administered 2016-10-17: 10 mg via INTRAVENOUS

## 2016-10-17 MED ORDER — SODIUM CHLORIDE 0.9% FLUSH: 9.0000 mL | INTRAVENOUS | Status: DC | PRN

## 2016-10-17 MED ORDER — POTASSIUM CHLORIDE 10 MEQ/50ML IV SOLN: 10.0000 meq | Freq: Every day | INTRAVENOUS | Status: DC | PRN

## 2016-10-17 MED ORDER — BUPIVACAINE HCL (PF) 0.5 % IJ SOLN
INTRAMUSCULAR | Status: DC | PRN
  Administered 2016-10-17: 10 mL

## 2016-10-17 MED ORDER — LACTATED RINGERS IV SOLN
INTRAVENOUS | Status: DC | PRN
Start: 2016-10-17 — End: 2016-10-17
  Administered 2016-10-17 (×2): via INTRAVENOUS

## 2016-10-17 MED ORDER — HYDROMORPHONE HCL 2 MG/ML IJ SOLN
INTRAMUSCULAR | Status: AC
  Administered 2016-10-17: 2 mg
  Filled 2016-10-17: qty 1

## 2016-10-17 MED ORDER — VENLAFAXINE HCL 75 MG PO TABS
75.0000 mg | ORAL_TABLET | Freq: Three times a day (TID) | ORAL | Status: DC
  Administered 2016-10-17 – 2016-10-21 (×12): 75 mg via ORAL
  Filled 2016-10-17 (×14): qty 1

## 2016-10-17 MED ORDER — SUGAMMADEX SODIUM 200 MG/2ML IV SOLN
INTRAVENOUS | Status: DC | PRN
  Administered 2016-10-17: 200 mg via INTRAVENOUS

## 2016-10-17 MED ORDER — POTASSIUM CHLORIDE CRYS ER 20 MEQ PO TBCR
30.0000 meq | EXTENDED_RELEASE_TABLET | Freq: Once | ORAL | Status: AC
  Administered 2016-10-17: 30 meq via ORAL
  Filled 2016-10-17: qty 1

## 2016-10-17 MED ORDER — KETAMINE HCL 10 MG/ML IJ SOLN
INTRAMUSCULAR | Status: DC | PRN
  Administered 2016-10-17 (×3): 20 mg via INTRAVENOUS

## 2016-10-17 MED ORDER — BUPIVACAINE 0.5 % ON-Q PUMP SINGLE CATH 400 ML
INJECTION | Status: DC | PRN
  Administered 2016-10-17: 400 mL

## 2016-10-17 MED ORDER — MIDAZOLAM HCL 5 MG/5ML IJ SOLN
INTRAMUSCULAR | Status: DC | PRN
  Administered 2016-10-17: 2 mg via INTRAVENOUS

## 2016-10-17 MED ORDER — PROPOFOL 10 MG/ML IV BOLUS
INTRAVENOUS | Status: AC
  Filled 2016-10-17: qty 20

## 2016-10-17 MED ORDER — LEVALBUTEROL HCL 0.63 MG/3ML IN NEBU
0.6300 mg | INHALATION_SOLUTION | Freq: Four times a day (QID) | RESPIRATORY_TRACT | Status: DC
  Administered 2016-10-17 – 2016-10-18 (×4): 0.63 mg via RESPIRATORY_TRACT
  Filled 2016-10-17 (×5): qty 3

## 2016-10-17 MED ORDER — DIPHENHYDRAMINE HCL 50 MG/ML IJ SOLN
INTRAMUSCULAR | Status: DC | PRN
  Administered 2016-10-17: 25 mg via INTRAVENOUS

## 2016-10-17 MED ORDER — LACTATED RINGERS IV SOLN: INTRAVENOUS | Status: DC

## 2016-10-17 MED ORDER — NALOXONE HCL 0.4 MG/ML IJ SOLN: 0.4000 mg | INTRAMUSCULAR | Status: DC | PRN

## 2016-10-17 MED ORDER — POTASSIUM CHLORIDE IN NACL 20-0.45 MEQ/L-% IV SOLN
INTRAVENOUS | Status: DC
  Administered 2016-10-17 – 2016-10-18 (×2): via INTRAVENOUS
  Filled 2016-10-17 (×6): qty 1000

## 2016-10-17 MED ORDER — MIDAZOLAM HCL 2 MG/2ML IJ SOLN
INTRAMUSCULAR | Status: AC
  Filled 2016-10-17: qty 2

## 2016-10-17 MED ORDER — VANCOMYCIN HCL IN DEXTROSE 1-5 GM/200ML-% IV SOLN
1000.0000 mg | INTRAVENOUS | Status: AC
  Administered 2016-10-17: 1000 mg via INTRAVENOUS
  Filled 2016-10-17: qty 200

## 2016-10-17 MED ORDER — PANTOPRAZOLE SODIUM 40 MG PO TBEC
40.0000 mg | DELAYED_RELEASE_TABLET | Freq: Every day | ORAL | Status: DC
  Administered 2016-10-18 – 2016-10-21 (×4): 40 mg via ORAL
  Filled 2016-10-17 (×4): qty 1

## 2016-10-17 MED ORDER — MEDROXYPROGESTERONE ACETATE 2.5 MG PO TABS
5.0000 mg | ORAL_TABLET | Freq: Every day | ORAL | Status: DC
  Administered 2016-10-18 – 2016-10-21 (×4): 5 mg via ORAL
  Filled 2016-10-17 (×3): qty 2
  Filled 2016-10-17: qty 1

## 2016-10-17 MED ORDER — SENNOSIDES-DOCUSATE SODIUM 8.6-50 MG PO TABS
1.0000 | ORAL_TABLET | Freq: Every day | ORAL | Status: DC
  Administered 2016-10-17 – 2016-10-20 (×4): 1 via ORAL
  Filled 2016-10-17 (×4): qty 1

## 2016-10-17 MED ORDER — LACTATED RINGERS IV SOLN
INTRAVENOUS | Status: DC | PRN
  Administered 2016-10-17: 07:00:00 via INTRAVENOUS

## 2016-10-17 MED ORDER — DIPHENHYDRAMINE HCL 12.5 MG/5ML PO ELIX: 12.5000 mg | ORAL_SOLUTION | Freq: Four times a day (QID) | ORAL | Status: DC | PRN

## 2016-10-17 MED ORDER — ZOLPIDEM TARTRATE 5 MG PO TABS
5.0000 mg | ORAL_TABLET | Freq: Every evening | ORAL | Status: DC | PRN
  Administered 2016-10-19 – 2016-10-20 (×2): 5 mg via ORAL
  Filled 2016-10-17 (×2): qty 1

## 2016-10-17 SURGICAL SUPPLY — 98 items
ADH SKN CLS APL DERMABOND .7 (GAUZE/BANDAGES/DRESSINGS) ×2
APL SRG 22X2 LUM MLBL SLNT (VASCULAR PRODUCTS)
APL SRG 7X2 LUM MLBL SLNT (VASCULAR PRODUCTS)
APPLICATOR TIP COSEAL (VASCULAR PRODUCTS) IMPLANT
APPLICATOR TIP EXT COSEAL (VASCULAR PRODUCTS) IMPLANT
BAG SPEC RTRVL LRG 6X4 10 (ENDOMECHANICALS) ×2
BLADE SURG 11 STRL SS (BLADE) IMPLANT
BRUSH CYTOL CELLEBRITY 1.5X140 (MISCELLANEOUS) IMPLANT
CANISTER SUCTION 2500CC (MISCELLANEOUS) ×3 IMPLANT
CATH KIT ON Q 5IN SLV (PAIN MANAGEMENT) IMPLANT
CATH KIT ON-Q SILVERSOAK 5 (CATHETERS) IMPLANT
CATH KIT ON-Q SILVERSOAK 5IN (CATHETERS) ×3 IMPLANT
CATH THORACIC 28FR (CATHETERS) ×1 IMPLANT
CATH THORACIC 36FR (CATHETERS) IMPLANT
CATH THORACIC 36FR RT ANG (CATHETERS) IMPLANT
CLIP TI MEDIUM 6 (CLIP) ×3 IMPLANT
CONN ST 1/4X3/8  BEN (MISCELLANEOUS) ×1
CONN ST 1/4X3/8 BEN (MISCELLANEOUS) IMPLANT
CONT SPEC 4OZ CLIKSEAL STRL BL (MISCELLANEOUS) ×14 IMPLANT
COVER TABLE BACK 60X90 (DRAPES) ×3 IMPLANT
DERMABOND ADVANCED (GAUZE/BANDAGES/DRESSINGS) ×1
DERMABOND ADVANCED .7 DNX12 (GAUZE/BANDAGES/DRESSINGS) IMPLANT
DRAIN CHANNEL 28F RND 3/8 FF (WOUND CARE) ×1 IMPLANT
DRAIN CHANNEL 32F RND 10.7 FF (WOUND CARE) IMPLANT
DRAPE LAPAROSCOPIC ABDOMINAL (DRAPES) ×3 IMPLANT
DRAPE WARM FLUID 44X44 (DRAPE) ×3 IMPLANT
DRILL BIT 7/64X5 (BIT) IMPLANT
DRSG TEGADERM 4X4.75 (GAUZE/BANDAGES/DRESSINGS) ×1 IMPLANT
ELECT BLADE 4.0 EZ CLEAN MEGAD (MISCELLANEOUS) ×3
ELECT BLADE 6.5 EXT (BLADE) ×1 IMPLANT
ELECT REM PT RETURN 9FT ADLT (ELECTROSURGICAL) ×3
ELECTRODE BLDE 4.0 EZ CLN MEGD (MISCELLANEOUS) ×2 IMPLANT
ELECTRODE REM PT RTRN 9FT ADLT (ELECTROSURGICAL) ×2 IMPLANT
FORCEPS BIOP RJ4 1.8 (CUTTING FORCEPS) IMPLANT
GAUZE SPONGE 4X4 12PLY STRL (GAUZE/BANDAGES/DRESSINGS) ×3 IMPLANT
GLOVE BIO SURGEON STRL SZ 6.5 (GLOVE) ×6 IMPLANT
GLOVE BIOGEL M 6.5 STRL (GLOVE) ×1 IMPLANT
GLOVE BIOGEL M 7.0 STRL (GLOVE) ×2 IMPLANT
GLOVE BIOGEL PI IND STRL 7.0 (GLOVE) IMPLANT
GLOVE BIOGEL PI INDICATOR 7.0 (GLOVE) ×3
GOWN STRL REUS W/ TWL LRG LVL3 (GOWN DISPOSABLE) ×8 IMPLANT
GOWN STRL REUS W/TWL LRG LVL3 (GOWN DISPOSABLE) ×15
KIT BASIN OR (CUSTOM PROCEDURE TRAY) ×3 IMPLANT
KIT CLEAN ENDO COMPLIANCE (KITS) ×3 IMPLANT
KIT ROOM TURNOVER OR (KITS) ×3 IMPLANT
KIT SUCTION CATH 14FR (SUCTIONS) ×3 IMPLANT
MARKER SKIN DUAL TIP RULER LAB (MISCELLANEOUS) IMPLANT
NDL BIOPSY TRANSBRONCH 21G (NEEDLE) IMPLANT
NEEDLE BIOPSY TRANSBRONCH 21G (NEEDLE) IMPLANT
NS IRRIG 1000ML POUR BTL (IV SOLUTION) ×10 IMPLANT
OIL SILICONE PENTAX (PARTS (SERVICE/REPAIRS)) ×3 IMPLANT
PACK CHEST (CUSTOM PROCEDURE TRAY) ×3 IMPLANT
PAD ARMBOARD 7.5X6 YLW CONV (MISCELLANEOUS) ×9 IMPLANT
PASSER SUT SWANSON 36MM LOOP (INSTRUMENTS) ×1 IMPLANT
POUCH SPECIMEN RETRIEVAL 10MM (ENDOMECHANICALS) ×1 IMPLANT
RELOAD GOLD ECHELON 45 (STAPLE) ×4 IMPLANT
RELOAD GREEN ECHELON 45 (STAPLE) ×1 IMPLANT
RELOAD STAPLE 35X2.5 WHT THIN (STAPLE) IMPLANT
SCISSORS LAP 5X35 DISP (ENDOMECHANICALS) IMPLANT
SEALANT SURG COSEAL 4ML (VASCULAR PRODUCTS) IMPLANT
SEALANT SURG COSEAL 8ML (VASCULAR PRODUCTS) IMPLANT
SOLUTION ANTI FOG 6CC (MISCELLANEOUS) ×3 IMPLANT
STAPLE RELOAD 2.5MM WHITE (STAPLE) ×21 IMPLANT
STAPLER ECHELON POWERED (MISCELLANEOUS) ×1 IMPLANT
STAPLER VASCULAR ECHELON 35 (CUTTER) ×1 IMPLANT
SUT PROLENE 3 0 SH DA (SUTURE) IMPLANT
SUT PROLENE 4 0 RB 1 (SUTURE)
SUT PROLENE 4-0 RB1 .5 CRCL 36 (SUTURE) IMPLANT
SUT SILK  1 MH (SUTURE) ×4
SUT SILK 1 MH (SUTURE) ×8 IMPLANT
SUT SILK 1 TIES 10X30 (SUTURE) IMPLANT
SUT SILK 2 0 SH (SUTURE) IMPLANT
SUT SILK 2 0SH CR/8 30 (SUTURE) IMPLANT
SUT SILK 3 0SH CR/8 30 (SUTURE) ×1 IMPLANT
SUT STEEL 1 (SUTURE) IMPLANT
SUT VIC AB 0 CTX 18 (SUTURE) ×3 IMPLANT
SUT VIC AB 1 CTX 18 (SUTURE) ×1 IMPLANT
SUT VIC AB 1 CTX 36 (SUTURE)
SUT VIC AB 1 CTX36XBRD ANBCTR (SUTURE) IMPLANT
SUT VIC AB 2-0 CTX 36 (SUTURE) ×1 IMPLANT
SUT VIC AB 2-0 UR6 27 (SUTURE) IMPLANT
SUT VIC AB 3-0 SH 8-18 (SUTURE) IMPLANT
SUT VIC AB 3-0 X1 27 (SUTURE) IMPLANT
SUT VICRYL 0 UR6 27IN ABS (SUTURE) IMPLANT
SUT VICRYL 2 TP 1 (SUTURE) ×1 IMPLANT
SWAB COLLECTION DEVICE MRSA (MISCELLANEOUS) IMPLANT
SYR 20ML ECCENTRIC (SYRINGE) ×3 IMPLANT
SYSTEM SAHARA CHEST DRAIN ATS (WOUND CARE) ×3 IMPLANT
TAPE CLOTH SURG 4X10 WHT LF (GAUZE/BANDAGES/DRESSINGS) ×1 IMPLANT
TAPE UMBILICAL COTTON 1/8X30 (MISCELLANEOUS) ×3 IMPLANT
TOWEL OR 17X24 6PK STRL BLUE (TOWEL DISPOSABLE) ×6 IMPLANT
TOWEL OR 17X26 10 PK STRL BLUE (TOWEL DISPOSABLE) ×6 IMPLANT
TRAP SPECIMEN MUCOUS 40CC (MISCELLANEOUS) IMPLANT
TRAY FOLEY CATH 16FRSI W/METER (SET/KITS/TRAYS/PACK) ×3 IMPLANT
TUBE ANAEROBIC SPECIMEN COL (MISCELLANEOUS) IMPLANT
TUBE CONNECTING 20X1/4 (TUBING) ×3 IMPLANT
TUNNELER SHEATH ON-Q 11GX8 DSP (PAIN MANAGEMENT) ×1 IMPLANT
WATER STERILE IRR 1000ML POUR (IV SOLUTION) ×6 IMPLANT

## 2016-10-17 NOTE — H&P (Signed)
[address]       [place] [address]             [phone]                    Gem L Sipp [facility] Medical Record #[id number] Date of Birth: 02/23/1956  Referring: Dr Arlyn Dunning  Primary Care: Horatio Pel, MD  Chief Complaint:    Lung Mass left   History of Present Illness:    [name] 60 y.o. female is seen in follow up for left upper lobe lung nodule. A needle aspiration of her cervical lymphadenopathy by interventional radiology was negative .  Also, because of the appearance of the nodule and lack of clear mediastinal lymphadenopathy she was seen for  evaluation for lobectomy.  She has been having chest pain  the pain changes frequently.  She will have dull substernal pain, sometimes it radiates to her neck, sometimes it radiates to her left breast. The pain has been present since 03/2015, was worse this year in May   She has smoked over the years,  started age 47 and smokes 1 1/2 ppd . She is smoking about 1 pack of cigarettes per month at this point.  She has been vaping 2.5 years with nicotine containing solution,  Abnormal chest xray lead to ct and PET of chest   Cardiology clearence done by Dr Sallyanne Kuster  Current Activity/ Functional Status:  Patient is independent with mobility/ambulation, transfers, ADL's, IADL's.   Zubrod Score: At the time of surgery this patient's most appropriate activity status/level should be described as: '[]'$     0    Normal activity, no symptoms '[x]'$     1    Restricted in physical strenuous activity but ambulatory, able to do out light work '[]'$     2    Ambulatory and capable of self care, unable to do work activities, up and about               >50 % of waking hours                              '[]'$     3    Only limited self care, in bed greater than 50% of waking hours '[]'$     4    Completely disabled, no self care, confined to bed or chair '[]'$     5    Moribund   Past Medical History:    Diagnosis Date  . ADD (attention deficit disorder)   . Carotid stenosis   . Carpal tunnel syndrome on both sides   . Cervical spondylosis without myelopathy   . COPD (chronic obstructive pulmonary disease) ([place])   . Costochondritis   . Depression   . Fibromyalgia   . GERD (gastroesophageal reflux disease)   . Heart murmur    dx 20-30 yrs ago.    . Lumbosacral spondylosis without myelopathy   . Migraine    has them 12-15 days a month  . Mitral valve prolapse   . MRSA (methicillin resistant staph aureus) culture positive   . MRSA (methicillin resistant Staphylococcus aureus)    was in her perirectal abscess  back in 2006  . Myalgia and myositis, unspecified   . Palpitations   . Paroxysmal supraventricular tachycardia ([place])   . Pneumonia    of last yr (2016)  . SVT (supraventricular tachycardia) ([place])  very brief runs  . Tobacco abuse     Past Surgical History:  Procedure Laterality Date  . CHOLECYSTECTOMY    . INCISION AND DRAINAGE PERIRECTAL ABSCESS    . LUMBAR DISC SURGERY     x 2  . TRIGGER FINGER RELEASE    . WISDOM TOOTH EXTRACTION      Family History  Problem Relation Age of Onset  . Hyperlipidemia Mother   . Hypertension Mother   . Hyperlipidemia Father   . Hypertension Father   . Diabetes Father   . Hypertension Brother   . Hyperlipidemia Brother     Social History   Social History  . Marital status: Divorced    Spouse name: N/A  . Number of children: N/A  . Years of education: N/A   Occupational History  . Not on file.   Social History Main Topics  . Smoking status: Current Every Day Smoker    Packs/day: 1.50    Types: E-cigarettes  . Smokeless tobacco: Never Used     Comment: ONLY USING VAPE   . Alcohol use No  . Drug use: No  . Sexual activity: Not on file     Comment: >10 years   Other Topics Concern  . Not on file   Social History Narrative  . No narrative on file    History  Smoking Status  . Current Every Day Smoker  .  Packs/day: 1.50  . Types: E-cigarettes  Smokeless Tobacco  . Never Used    Comment: ONLY USING VAPE     History  Alcohol Use No     Allergies  Allergen Reactions  . Carafate [Sucralfate] Hives and Other (See Comments)    CHEST PAIN    . Cefaclor Hives and Other (See Comments)    CHEST PAIN    . Depakote [Divalproex Sodium] Hives and Other (See Comments)    CHEST PAIN    . Nitrofurantoin Hives    CHEST PAIN   . Sulfamethoxazole Hives    CHEST PAIN    . Wellbutrin [Bupropion] Hives and Other (See Comments)    CHEST PAIN     Current Facility-Administered Medications  Medication Dose Route Frequency Provider Last Rate Last Dose  . vancomycin (VANCOCIN) IVPB 1000 mg/200 mL premix  1,000 mg Intravenous On Call to OR [name], MD       Facility-Administered Medications Ordered in Other Encounters  Medication Dose Route Frequency Provider Last Rate Last Dose  . lactated ringers infusion    Continuous PRN Candis Shine, CRNA          Review of Systems:     Cardiac Review of Systems: Y or N  Chest Pain [ y   ]  Resting SOB [  y ] Exertional SOB  [ y ]  Vertell Limber Blue.Reese  ]   Pedal Edema [ n  ]    Palpitations [ n ] Syncope  [ n ]   Presyncope [  n ]  General Review of Systems: [Y] = yes [  ]=no Constitional: recent weight change [ n ];  Wt loss over the last 3 months [   ] anorexia [  ]; fatigue [ y ]; nausea [  ]; night sweats [  ]; fever [  ]; or chills [  ];          Dental: poor dentition[  ]; Last Dentist visit:   Eye : blurred vision [  ]; diplopia [   ];  vision changes [  ];  Amaurosis fugax[  ]; Resp: cough [ y ];  wheezing[y  ];  hemoptysis[ n ]; shortness of breath[y  ]; paroxysmal nocturnal dyspnea[y  ]; dyspnea on exertion[ y ]; or orthopnea[  ];  GI:  gallstones[  ], vomiting[  ];  dysphagia[  ]; melena[  ];  hematochezia [  ]; heartburn[  ];   Hx of  Colonoscopy[  ]; GU: kidney stones [  ]; hematuria[  ];   dysuria [  ];  nocturia[  ];  history of      obstruction [  ]; urinary frequency [  ]             Skin: rash, swelling[  ];, hair loss[  ];  peripheral edema[  ];  or itching[  ]; Musculosketetal: myalgias[ y ];  joint swelling[ y ];  joint erythema[  ];  joint pain[y  ];  back pain[ y ];  Heme/Lymph: bruising[  ];  bleeding[  ];  anemia[  ];  Neuro: TIA[n  ];  headaches[  ];  stroke[  ];  vertigo[  ];  seizures[  ];   paresthesias[  ];  difficulty walking[  n];  Psych:depression[  ]; anxiety[  ];  Endocrine: diabetes[  ];  thyroid dysfunction[  ];  Immunizations: Flu up to date Blue.Reese  ]; Pneumococcal up to date [ y ];  Other:  Physical Exam: BP 96/62   Pulse 77   Temp 98.5 F (36.9 C) (Oral)   Resp 18   Ht '5\' 7"'$  (1.702 m)   Wt 161 lb (73 kg)   LMP 09/07/2006 (Within Years) Comment: >10 years  SpO2 100%   BMI 25.22 kg/m   PHYSICAL EXAMINATION: General appearance: alert and cooperative Head: Normocephalic, without obvious abnormality, atraumatic Neck: no adenopathy, no carotid bruit, no JVD, supple, symmetrical, trachea midline and thyroid not enlarged, symmetric, no tenderness/mass/nodules Lymph nodes: Cervical, supraclavicular, and axillary nodes normal. Resp: clear to auscultation bilaterally Back: symmetric, no curvature. ROM normal. No CVA tenderness. Cardio: regular rate and rhythm, S1, S2 normal, no murmur, click, rub or gallop GI: soft, non-tender; bowel sounds normal; no masses,  no organomegaly Extremities: extremities normal, atraumatic, no cyanosis or edema Neurologic: Grossly normal  Full dp and pt pulses bilateral   Diagnostic Studies & Laboratory data:     Recent Radiology Findings:  Dg Chest 2 View  Result Date: 10/16/2016 CLINICAL DATA:  Preop exam. EXAM: CHEST  2 VIEW COMPARISON:  05/16/2016 FINDINGS: Left apical opacity correlating with known nodule. There is no edema, consolidation, effusion, or pneumothorax. Normal heart size and mediastinal contours. Cholecystectomy clips IMPRESSION: 1. No active  cardiopulmonary disease. 2. Known left apical nodule. Electronically Signed   By: Monte Fantasia M.D.   On: 10/16/2016 09:30    Ct Chest Wo Contrast  Result Date: 09/04/2016 CLINICAL DATA:  Chest pain and shortness of breath off and on for several months EXAM: CT CHEST WITHOUT CONTRAST TECHNIQUE: Multidetector CT imaging of the chest was performed following the standard protocol without IV contrast. COMPARISON:  07/27/2016 FINDINGS: Cardiovascular: Somewhat limited due the lack of IV contrast. Aortic calcifications are noted without aneurysmal dilatation. Coronary calcifications are seen as well. Mediastinum/Nodes: The thoracic inlet is within normal limits. Few scattered small AP window nodes are seen. The largest of these measures approximately 8 mm in short axis. No definitive hilar adenopathy is noted. The esophagus as visualize is within normal limits. Lungs/Pleura: Emphysematous changes are noted  bilaterally. The lungs are well aerated without focal infiltrate or sizable effusion. In the left upper lobe there is a spiculated mass lesion which measures approximately 1.7 cm in greatest dimension on axial imaging consistent with primary pulmonary neoplasm. On the coronal imaging the lesion measures approximately 2.7 cm and has some linear density extending superiorly to the pleural margin. This may represent some localized extension. No other nodules are identified. Upper Abdomen: Within normal limits. Musculoskeletal: No chest wall mass or suspicious bone lesions identified. IMPRESSION: Spiculated mass lesion in the left upper lobe as described above consistent with primary pulmonary neoplasm. Few scattered AP window nodes are seen. PET-CT is recommended for further evaluation These results will be called to the ordering clinician or representative by the Radiologist Assistant, and communication documented in the PACS or zVision Dashboard. Electronically Signed   By: Inez Catalina M.D.   On: 09/04/2016 11:38    Nm Pet Image Initial (pi) Skull Base To Thigh  Result Date: 09/08/2016 CLINICAL DATA:  Initial treatment strategy for left upper lobe lung nodule. EXAM: NUCLEAR MEDICINE PET SKULL BASE TO THIGH TECHNIQUE: 1214 mCi F-18 FDG was injected intravenously. Full-ring PET imaging was performed from the skull base to thigh after the radiotracer. CT data was obtained and used for attenuation correction and anatomic localization. FASTING BLOOD GLUCOSE:  Value: 74 mg/dl COMPARISON:  Chest CT 09/04/2016. FINDINGS: NECK Mildly hypermetabolic bilateral level 2 nodes. A left-sided node measures 9 mm and a S.U.V. max of 3.1 on image 24/series 4. A right-sided node measures 8 mm and a S.U.V. max of 4.1 on image 25/series 4. CHEST Hypermetabolism corresponding to the left apical lung nodule. This measures 1.7 cm and a S.U.V. max of 9.1 on image 59/series 4. No thoracic nodal hypermetabolism. ABDOMEN/PELVIS No areas of abnormal hypermetabolism. SKELETON Right Shoulder muscular activity is likely due to min after radiopharmaceutical injection. There is focal activity involving the left facet at C3-4, favored to be degenerative. This measures a S.U.V. max of 4.1, including on image 24/series 4. CT IMAGES PERFORMED FOR ATTENUATION CORRECTION Bilateral carotid atherosclerosis. Aortic and branch vessel atherosclerosis. Borderline cardiomegaly. Multivessel coronary artery atherosclerosis. Mild centrilobular emphysema. Other chest findings deferred to recent diagnostic CT. No acute superimposed process. Normal adrenal glands. Cholecystectomy. Abdominal aortic atherosclerosis. Intrauterine device. IMPRESSION: 1. Left apical spiculated hypermetabolic pulmonary nodule, consistent with primary bronchogenic carcinoma. 2. No thoracic nodal metastasis. 3. Bilateral level 2 cervical nodes with mild hypermetabolism. Suspicious for but not diagnostic of a somewhat unusual initial distribution of nodal metastasis from primary bronchogenic  carcinoma. As reactive activity could look similar, consider sampling. 4. Age advanced coronary artery atherosclerosis. Recommend assessment of coronary risk factors and consideration of medical therapy. 5.  Aortic atherosclerosis. Electronically Signed   By: Abigail Miyamoto M.D.   On: 09/08/2016 14:47     I have independently reviewed the above radiologic studies.  Recent Lab Findings: Lab Results  Component Value Date   WBC 6.5 10/16/2016   HGB 12.9 10/16/2016   HCT 38.7 10/16/2016   PLT 202 10/16/2016   GLUCOSE 98 10/16/2016   CHOL 182 04/09/2007   TRIG 68 04/09/2007   HDL 51 04/09/2007   LDLCALC 117 04/09/2007   ALT 13 (L) 10/16/2016   AST 17 10/16/2016   NA 139 10/16/2016   K 3.8 10/16/2016   CL 111 10/16/2016   CREATININE 0.75 10/16/2016   BUN 6 10/16/2016   CO2 20 (L) 10/16/2016   INR 1.05 10/16/2016   Diagnosis Lymph node,  needle/core biopsy, Left Cervical LN - THERE IS NO EVIDENCE OF CARCINOMA IN 1 OF 1 LYMPH NODE (0/1). JOSHUA KISH MD  PFT's  FEV1 2.05 70% DLCO 18.52 65% Interpretation: The FEV1, FEV1/FVC ratio and FEF25-75% are reduced indicating airway obstruction. The normal airway resistance and decreased specific conductance indicate a peripheral or small airway disease. While the TLC, FRC and SVC are within normal limits, the RV is increased. Following administration of bronchodilators, there is no significant response. The reduced diffusing capacity indicates a mild loss of functional alveolar capillary surface. However, the diffusing capacity was not corrected for the patient's hemoglobin. Conclusions: Although there is airway obstruction and a diffusion defect suggesting emphysema, the absence of overinflation is inconsistent with that diagnosis.  Assessment / Plan:     1/ Left upper lobe mass suggestive of primary lung cancer 2/ Pulmonary Function Diagnosis: Mild Obstructive Airways Disease Mild Diffusion Defect 3/ Cardiac  evaluation and clearence  done   The goals risks and alternatives of the planned surgical procedure bronchoscopy, left VATS and Lung resection   have been discussed with the patient in detail. The risks of the procedure including death, infection, stroke, myocardial infarction, bleeding, blood transfusion have all been discussed specifically.  I have quoted [name] a 3 % of perioperative mortality and a complication rate as high as 30 %. The patient's questions have been answered.[name] is willing  to proceed with the planned procedure.  [name] MD      [address] [place] [address] Office [phone]   [place] [phone]  10/17/2016 7:12 AM

## 2016-10-17 NOTE — Progress Notes (Signed)
CT Surgery PM Rounds  Some L chest incisional pain- using PCA Minimal chest tube drainage and air leak nsr , good O2 sats Good breath sounds bilaterally

## 2016-10-17 NOTE — Anesthesia Procedure Notes (Signed)
Central Venous Catheter Insertion Performed by: anesthesiologist 10/17/2016 7:20 AM Patient location: Pre-op. Preanesthetic checklist: patient identified, IV checked, site marked, risks and benefits discussed, surgical consent, monitors and equipment checked, pre-op evaluation, timeout performed and anesthesia consent Position: Trendelenburg Lidocaine 1% used for infiltration Landmarks identified and Seldinger technique used Catheter size: 8 Fr Central line was placed.Double lumen Procedure performed using ultrasound guided technique. Attempts: 1 Following insertion, dressing applied, line sutured and Biopatch. Post procedure assessment: blood return through all ports. Patient tolerated the procedure well with no immediate complications.

## 2016-10-17 NOTE — Anesthesia Postprocedure Evaluation (Signed)
Anesthesia Post Note  Patient: [name]  Procedure(s) Performed: Procedure(s) (LRB): VIDEO BRONCHOSCOPY (N/A) VIDEO ASSISTED THORACOSCOPY (VATS)/ MINI THORACOTOMY, LEFT UPPER LOBE WEDGE RESECTION, COMPLETION LOBECTOMY LEFT UPPER LOBE, NODE DISECTION, ON Q PLACEMENT (Left)  Patient location during evaluation: PACU Anesthesia Type: General Level of consciousness: awake and alert Pain management: pain level controlled Vital Signs Assessment: post-procedure vital signs reviewed and stable Respiratory status: spontaneous breathing, nonlabored ventilation, respiratory function stable and patient connected to nasal cannula oxygen Cardiovascular status: blood pressure returned to baseline and stable Postop Assessment: no signs of nausea or vomiting Anesthetic complications: no    Last Vitals:  Vitals:   10/17/16 1245 10/17/16 1255  BP:    Pulse: 96 98  Resp: 12 11  Temp:  36.1 C    Last Pain:  Vitals:   10/17/16 1245  TempSrc:   PainSc: [place] Grizelda Piscopo

## 2016-10-17 NOTE — Anesthesia Procedure Notes (Signed)
Procedure Name: Intubation Date/Time: 10/17/2016 8:02 AM Performed by: Candis Shine Pre-anesthesia Checklist: Patient identified, Emergency Drugs available, Suction available and Patient being monitored Patient Re-evaluated:Patient Re-evaluated prior to inductionOxygen Delivery Method: Circle System Utilized Preoxygenation: Pre-oxygenation with 100% oxygen Laryngoscope Size: Glidescope and 4 Grade View: Grade I Endobronchial tube: Left, Double lumen EBT, EBT position confirmed by auscultation and EBT position confirmed by fiberoptic bronchoscope and 37 Fr Number of attempts: 1 Airway Equipment and Method: Fiberoptic brochoscope and Video-laryngoscopy (cook exchange catheter) Placement Confirmation: ETT inserted through vocal cords under direct vision,  positive ETCO2 and breath sounds checked- equal and bilateral Tube secured with: Tape Dental Injury: Teeth and Oropharynx as per pre-operative assessment  Comments: 8.0 ETT replaced with 37 Fr left DLT via exchange with cook catheter and glidescope for assistance.

## 2016-10-17 NOTE — Brief Op Note (Addendum)
10/17/2016  12:58 PM  PATIENT:  [name]  60 y.o. female  PRE-OPERATIVE DIAGNOSIS:  LUNG LESION  POST-OPERATIVE DIAGNOSIS:  LUNG LESION  PROCEDURE:  Procedure(s):  VIDEO BRONCHOSCOPY (N/A)  VIDEO ASSISTED THORACOSCOPY (VATS)/ MINI THORACOTOMY, -Wedge Resection Left Upper Lobe -Completion Lobectomy Left Upper Lobe -Lymph Node Dissection -Insertion of On-Q Pain Catheter  SURGEON:  Surgeon(s) and Role:    * [name], MD - Primary  PHYSICIAN ASSISTANT: Erin Barrett PA-C  ANESTHESIA:   general  EBL:  Total I/O In: 1300 [I.V.:1300] Out: 510 [Urine:350; Blood:100; Chest Tube:60]  BLOOD ADMINISTERED:none   DRAINS: 28 Straight and Blake Chest Tube   LOCAL MEDICATIONS USED:  NONE  SPECIMEN:  Source of Specimen:  Wedge Resection Left Upper Lobe, Completion Left Upper Lobe, Lymph Node Samping  DISPOSITION OF SPECIMEN:  PATHOLOGY  COUNTS:  YES  TOURNIQUET:  * No tourniquets in log *  DICTATION: .Dragon Dictation  PLAN OF CARE: Admit to inpatient   PATIENT DISPOSITION:  PACU - hemodynamically stable.   Delay start of Pharmacological VTE agent (>24hrs) due to surgical blood loss or risk of bleeding: yes

## 2016-10-17 NOTE — Care Management Note (Addendum)
Case Management Note  Patient Details  Name: [name] MRN: [id number] Date of Birth: 07-19-1956  Subjective/Objective:   S/p VIDEO ASSISTED THORACOSCOPY (VATS)/ MINI THORACOTOMY, -Wedge Resection Left Upper Lobe -Completion Lobectomy Left Upper Lobe -Lymph Node Dissection -Insertion of On-Q Pain Catheter                 Action/Plan:  PTA independent from home alone.  Pt has a close friend that will help at discharge if needed.  CM will continue to follow for discharge needs   Expected Discharge Date:                  Expected Discharge Plan:     In-House Referral:     Discharge planning Services  CM Consult  Post Acute Care Choice:    Choice offered to:     DME Arranged:    DME Agency:     HH Arranged:    HH Agency:     Status of Service:  In process, will continue to follow  If discussed at Long Length of Stay Meetings, dates discussed:    Additional Comments:  Maryclare Labrador, RN 10/17/2016, 3:30 PM

## 2016-10-17 NOTE — Anesthesia Procedure Notes (Signed)
Procedure Name: Intubation Date/Time: 10/17/2016 7:51 AM Performed by: Candis Shine Pre-anesthesia Checklist: Patient identified, Emergency Drugs available, Suction available and Patient being monitored Patient Re-evaluated:Patient Re-evaluated prior to inductionOxygen Delivery Method: Circle System Utilized Preoxygenation: Pre-oxygenation with 100% oxygen Intubation Type: IV induction Ventilation: Mask ventilation without difficulty Laryngoscope Size: Mac and 3 Grade View: Grade I Tube type: Oral Tube size: 8.0 mm Number of attempts: 1 Airway Equipment and Method: Stylet and Oral airway Placement Confirmation: ETT inserted through vocal cords under direct vision,  positive ETCO2 and breath sounds checked- equal and bilateral Secured at: 23 cm Tube secured with: Tape Dental Injury: Teeth and Oropharynx as per pre-operative assessment

## 2016-10-17 NOTE — Transfer of Care (Signed)
Immediate Anesthesia Transfer of Care Note  Patient: [name]  Procedure(s) Performed: Procedure(s): VIDEO BRONCHOSCOPY (N/A) VIDEO ASSISTED THORACOSCOPY (VATS)/ MINI THORACOTOMY, LEFT UPPER LOBE WEDGE RESECTION, COMPLETION LOBECTOMY LEFT UPPER LOBE, NODE DISECTION, ON Q PLACEMENT (Left)  Patient Location: PACU  Anesthesia Type:General  Level of Consciousness: awake, alert  and oriented  Airway & Oxygen Therapy: Patient Spontanous Breathing and Patient connected to nasal cannula oxygen  Post-op Assessment: Report given to RN and Post -op Vital signs reviewed and stable  Post vital signs: Reviewed and stable  Last Vitals:  Vitals:   10/17/16 0729 10/17/16 0731  BP:    Pulse: 72 70  Resp: 20 14  Temp:      Last Pain:  Vitals:   10/17/16 0620  TempSrc: Oral  PainSc:          Complications: No apparent anesthesia complications

## 2016-10-18 ENCOUNTER — Inpatient Hospital Stay (HOSPITAL_COMMUNITY): Payer: Medicare Other

## 2016-10-18 ENCOUNTER — Encounter (HOSPITAL_COMMUNITY): Payer: Self-pay | Admitting: Cardiothoracic Surgery

## 2016-10-18 LAB — POCT I-STAT 3, ART BLOOD GAS (G3+)
Acid-Base Excess: 2 mmol/L (ref 0.0–2.0)
Bicarbonate: 27.7 mmol/L (ref 20.0–28.0)
O2 Saturation: 98 %
Patient temperature: 98
TCO2: 29 mmol/L (ref 0–100)
pCO2 arterial: 47.7 mmHg (ref 32.0–48.0)
pH, Arterial: 7.37 (ref 7.350–7.450)
pO2, Arterial: 102 mmHg (ref 83.0–108.0)

## 2016-10-18 LAB — BASIC METABOLIC PANEL
ANION GAP: 7 (ref 5–15)
BUN: 6 mg/dL (ref 6–20)
CALCIUM: 8.5 mg/dL — AB (ref 8.9–10.3)
CO2: 24 mmol/L (ref 22–32)
Chloride: 108 mmol/L (ref 101–111)
Creatinine, Ser: 0.62 mg/dL (ref 0.44–1.00)
Glucose, Bld: 104 mg/dL — ABNORMAL HIGH (ref 65–99)
POTASSIUM: 4.4 mmol/L (ref 3.5–5.1)
Sodium: 139 mmol/L (ref 135–145)

## 2016-10-18 LAB — CBC
HCT: 37.2 % (ref 36.0–46.0)
Hemoglobin: 12.1 g/dL (ref 12.0–15.0)
MCH: 30.8 pg (ref 26.0–34.0)
MCHC: 32.5 g/dL (ref 30.0–36.0)
MCV: 94.7 fL (ref 78.0–100.0)
PLATELETS: 200 10*3/uL (ref 150–400)
RBC: 3.93 MIL/uL (ref 3.87–5.11)
RDW: 12.9 % (ref 11.5–15.5)
WBC: 10.1 10*3/uL (ref 4.0–10.5)

## 2016-10-18 MED ORDER — ENOXAPARIN SODIUM 40 MG/0.4ML ~~LOC~~ SOLN
40.0000 mg | SUBCUTANEOUS | Status: DC
  Administered 2016-10-18 – 2016-10-21 (×4): 40 mg via SUBCUTANEOUS
  Filled 2016-10-18 (×4): qty 0.4

## 2016-10-18 NOTE — Progress Notes (Signed)
Patient ID: [name], female   DOB: 05/16/1956, 60 y.o.   MRN: [id number] TCTS DAILY ICU PROGRESS NOTE                   [address]            [place] [address]          [phone]   1 Day Post-Op Procedure(s) (LRB): VIDEO BRONCHOSCOPY (N/A) VIDEO ASSISTED THORACOSCOPY (VATS)/ MINI THORACOTOMY, LEFT UPPER LOBE WEDGE RESECTION, COMPLETION LOBECTOMY LEFT UPPER LOBE, NODE DISECTION, ON Q PLACEMENT (Left)  Total Length of Stay:  LOS: 1 day   Subjective: awake and alert, walked this am  Objective: Vital signs in last 24 hours: Temp:  [97 F (36.1 C)-99.3 F (37.4 C)] 98.8 F (37.1 C) (11/15 0400) Pulse Rate:  [66-107] 77 (11/15 0700) Cardiac Rhythm: Normal sinus rhythm (11/15 0400) Resp:  [10-21] 12 (11/15 0700) BP: (89-143)/(49-88) 125/67 (11/15 0500) SpO2:  [93 %-100 %] 100 % (11/15 0700) Arterial Line BP: (116-178)/(63-95) 136/70 (11/15 0700)  Filed Weights   10/17/16 0620  Weight: 161 lb (73 kg)    Weight change:    Hemodynamic parameters for last 24 hours:    Intake/Output from previous day: 11/14 0701 - 11/15 0700 In: 3153.3 [P.O.:240; I.V.:2913.3] Out: 9937 [Urine:3625; Blood:100; Chest Tube:300]  Intake/Output this shift: No intake/output data recorded.  Current Meds: Scheduled Meds: . acetaminophen  1,000 mg Oral Q6H   Or  . acetaminophen (TYLENOL) oral liquid 160 mg/5 mL  1,000 mg Oral Q6H  . aspirin EC  81 mg Oral Daily  . bisacodyl  10 mg Oral Daily  . estradiol  1 mg Oral Daily  . fentaNYL   Intravenous Q4H  . levalbuterol  0.63 mg Nebulization Q6H  . loratadine  10 mg Oral Daily  . medroxyPROGESTERone  5 mg Oral Daily  . methocarbamol  750 mg Oral QID  . metoCLOPramide (REGLAN) injection  10 mg Intravenous Q6H  . multivitamin with minerals  1 tablet Oral Daily  . pantoprazole  40 mg Oral Daily  . pregabalin  150 mg Oral BID  . senna-docusate  1 tablet Oral QHS  . venlafaxine  75 mg Oral TID WC   Continuous  Infusions: . 0.45 % NaCl with KCl 20 mEq / L 100 mL/hr at 10/18/16 0600  . lactated ringers     PRN Meds:.diphenhydrAMINE **OR** diphenhydrAMINE, famotidine, naloxone **AND** sodium chloride flush, ondansetron (ZOFRAN) IV, ondansetron (ZOFRAN) IV, oxyCODONE-acetaminophen, potassium chloride, zolpidem  General appearance: alert and cooperative Neurologic: intact Heart: regular rate and rhythm, S1, S2 normal, no murmur, click, rub or gallop Lungs: diminished breath sounds bibasilar Abdomen: soft, non-tender; bowel sounds normal; no masses,  no organomegaly Extremities: extremities normal, atraumatic, no cyanosis or edema and Homans sign is negative, no sign of DVT Wound: no air leak with cough  Lab Results: CBC: Recent Labs  10/16/16 0903 10/17/16 1332 10/18/16 0439  WBC 6.5  --  10.1  HGB 12.9 13.6 12.1  HCT 38.7 40.0 37.2  PLT 202  --  200   BMET:  Recent Labs  10/16/16 0903 10/17/16 1332 10/18/16 0439  NA 139 141 139  K 3.8 3.8 4.4  CL 111  --  108  CO2 20*  --  24  GLUCOSE 98 128* 104*  BUN 6  --  6  CREATININE 0.75  --  0.62  CALCIUM 9.2  --  8.5*    CMET: Lab Results  Component Value Date   WBC 10.1 10/18/2016   HGB 12.1 10/18/2016   HCT 37.2 10/18/2016   PLT 200 10/18/2016   GLUCOSE 104 (H) 10/18/2016   CHOL 182 04/09/2007   TRIG 68 04/09/2007   HDL 51 04/09/2007   LDLCALC 117 04/09/2007   ALT 13 (L) 10/16/2016   AST 17 10/16/2016   NA 139 10/18/2016   K 4.4 10/18/2016   CL 108 10/18/2016   CREATININE 0.62 10/18/2016   BUN 6 10/18/2016   CO2 24 10/18/2016   INR 1.05 10/16/2016    PT/INR:  Recent Labs  10/16/16 0903  LABPROT 13.7  INR 1.05   Radiology: Dg Chest Port 1 View  Result Date: 10/17/2016 CLINICAL DATA:  Status post lobectomy .  Left-sided chest tube. EXAM: PORTABLE CHEST 1 VIEW COMPARISON:  1 day prior FINDINGS: Right internal jugular line with tip at low SVC. Patient rotated minimally left. Midline trachea. Normal heart size.  Two left chest tubes. Approximately 5% left apical pneumothorax, with visceral pleural line 4 mm from chest wall maximally. Clear lungs. IMPRESSION: Left chest tubes in place with 5% left apical pneumothorax. Electronically Signed   By: Abigail Miyamoto M.D.   On: 10/17/2016 12:39        Assessment/Plan: S/P Procedure(s) (LRB): VIDEO BRONCHOSCOPY (N/A) VIDEO ASSISTED THORACOSCOPY (VATS)/ MINI THORACOTOMY, LEFT UPPER LOBE WEDGE RESECTION, COMPLETION LOBECTOMY LEFT UPPER LOBE, NODE DISECTION, ON Q PLACEMENT (Left) Mobilize Plan for transfer to step-down: see transfer orders See progression orders Expected Acute  Blood - loss Anemia Good respiratory function D/c aline and foley     [name] 10/18/2016 7:38 AM

## 2016-10-18 NOTE — Progress Notes (Addendum)
10/18/2016 10:01 AM Nursing note ON Q pump site noted to be leaking during dressing change. Dr. Servando Snare and Katheran Awe [facility] paged and made aware. Orders received to d/c by Katheran Awe [facility] for Dr. Servando Snare. Orders enacted. Pt. Tolerated well.  Kennard Fildes, Arville Lime

## 2016-10-18 NOTE — Progress Notes (Signed)
Patient ID: [name], female   DOB: 1956/04/27, 60 y.o.   MRN: [id number] EVENING ROUNDS NOTE :     [address]       [place] [address]             [phone]                 1 Day Post-Op Procedure(s) (LRB): VIDEO BRONCHOSCOPY (N/A) VIDEO ASSISTED THORACOSCOPY (VATS)/ MINI THORACOTOMY, LEFT UPPER LOBE WEDGE RESECTION, COMPLETION LOBECTOMY LEFT UPPER LOBE, NODE DISECTION, ON Q PLACEMENT (Left)  Total Length of Stay:  LOS: 1 day  BP 120/67   Pulse (!) 108   Temp 98.9 F (37.2 C) (Oral)   Resp 15   Ht '5\' 7"'$  (1.702 m)   Wt 161 lb (73 kg)   LMP 09/07/2006 (Within Years) Comment: >10 years  SpO2 98%   BMI 25.22 kg/m   .Intake/Output      11/14 0701 - 11/15 0700 11/15 0701 - 11/16 0700   P.O. 240 480   I.V. (mL/kg) 2913.3 (39.9) 110 (1.5)   Total Intake(mL/kg) 3153.3 (43.2) 590 (8.1)   Urine (mL/kg/hr) 3625 (2.1) 850 (1)   Blood 100 (0.1)    Chest Tube 300 (0.2) 80 (0.1)   Total Output 4025 930   Net -871.7 -340          . 0.45 % NaCl with KCl 20 mEq / L 10 mL/hr at 10/18/16 0823  . lactated ringers       Lab Results  Component Value Date   WBC 10.1 10/18/2016   HGB 12.1 10/18/2016   HCT 37.2 10/18/2016   PLT 200 10/18/2016   GLUCOSE 104 (H) 10/18/2016   CHOL 182 04/09/2007   TRIG 68 04/09/2007   HDL 51 04/09/2007   LDLCALC 117 04/09/2007   ALT 13 (L) 10/16/2016   AST 17 10/16/2016   NA 139 10/18/2016   K 4.4 10/18/2016   CL 108 10/18/2016   CREATININE 0.62 10/18/2016   BUN 6 10/18/2016   CO2 24 10/18/2016   INR 1.05 10/16/2016   Path discussed with patient , no air leak   Lung cancer, left upper lobe ([place])   Staging form: Lung, AJCC 7th Edition   - Pathologic stage from 10/17/2016: Stage IA (T1b, N0, cM0) - Signed by [name], MD on 10/18/2016   [name] MD  Beeper [phone] Office [phone] 10/18/2016 6:10 PM

## 2016-10-18 NOTE — Progress Notes (Signed)
      [address]       [place] [address]             [phone]      Comfortable  BP 124/68   Pulse (!) 104   Temp 99.4 F (37.4 C) (Oral)   Resp 14   Ht '5\' 7"'$  (1.702 m)   Wt 161 lb (73 kg)   LMP 09/07/2006 (Within Years) Comment: >10 years  SpO2 97%   BMI 25.22 kg/m    Intake/Output Summary (Last 24 hours) at 10/18/16 2347 Last data filed at 10/18/16 1800  Gross per 24 hour  Intake             1410 ml  Output             1930 ml  Net             -520 ml    Doing well POD # 1  Awaiting bed on stepdown  Winter Trefz C. Roxan Hockey, MD [facility] [phone]

## 2016-10-19 ENCOUNTER — Inpatient Hospital Stay (HOSPITAL_COMMUNITY): Payer: Medicare Other

## 2016-10-19 LAB — CBC
HCT: 39.2 % (ref 36.0–46.0)
Hemoglobin: 12.8 g/dL (ref 12.0–15.0)
MCH: 31.4 pg (ref 26.0–34.0)
MCHC: 32.7 g/dL (ref 30.0–36.0)
MCV: 96.1 fL (ref 78.0–100.0)
PLATELETS: 207 10*3/uL (ref 150–400)
RBC: 4.08 MIL/uL (ref 3.87–5.11)
RDW: 13 % (ref 11.5–15.5)
WBC: 9.6 10*3/uL (ref 4.0–10.5)

## 2016-10-19 LAB — COMPREHENSIVE METABOLIC PANEL
ALK PHOS: 76 U/L (ref 38–126)
ALT: 15 U/L (ref 14–54)
AST: 24 U/L (ref 15–41)
Albumin: 3.3 g/dL — ABNORMAL LOW (ref 3.5–5.0)
Anion gap: 5 (ref 5–15)
BILIRUBIN TOTAL: 0.7 mg/dL (ref 0.3–1.2)
BUN: 5 mg/dL — ABNORMAL LOW (ref 6–20)
CALCIUM: 8.9 mg/dL (ref 8.9–10.3)
CHLORIDE: 104 mmol/L (ref 101–111)
CO2: 29 mmol/L (ref 22–32)
CREATININE: 0.64 mg/dL (ref 0.44–1.00)
Glucose, Bld: 117 mg/dL — ABNORMAL HIGH (ref 65–99)
Potassium: 4.1 mmol/L (ref 3.5–5.1)
Sodium: 138 mmol/L (ref 135–145)
TOTAL PROTEIN: 6.3 g/dL — AB (ref 6.5–8.1)

## 2016-10-19 MED ORDER — METOPROLOL TARTRATE 12.5 MG HALF TABLET
12.5000 mg | ORAL_TABLET | Freq: Two times a day (BID) | ORAL | Status: DC
  Administered 2016-10-19 – 2016-10-21 (×4): 12.5 mg via ORAL
  Filled 2016-10-19 (×4): qty 1

## 2016-10-19 MED ORDER — LEVALBUTEROL HCL 0.63 MG/3ML IN NEBU: 0.6300 mg | INHALATION_SOLUTION | Freq: Four times a day (QID) | RESPIRATORY_TRACT | Status: DC | PRN

## 2016-10-19 NOTE — Progress Notes (Signed)
Patient ID: [name], female   DOB: December 19, 1955, 60 y.o.   MRN: [id number]  SICU Evening Rounds:  Hemodynamically stable  sats 97%  Ambulating PCA off.  Awaiting bed in stepdown

## 2016-10-19 NOTE — Progress Notes (Signed)
Patient ID: [name], female   DOB: 02-23-1956, 60 y.o.   MRN: [id number] TCTS DAILY ICU PROGRESS NOTE                   [address]            [place] [address]          [phone]   2 Days Post-Op Procedure(s) (LRB): VIDEO BRONCHOSCOPY (N/A) VIDEO ASSISTED THORACOSCOPY (VATS)/ MINI THORACOTOMY, LEFT UPPER LOBE WEDGE RESECTION, COMPLETION LOBECTOMY LEFT UPPER LOBE, NODE DISECTION, ON Q PLACEMENT (Left)  Total Length of Stay:  LOS: 2 days   Subjective: Up walking in unit this am, complaint of left shoulder pain  Objective: Vital signs in last 24 hours: Temp:  [98.2 F (36.8 C)-99.4 F (37.4 C)] 98.8 F (37.1 C) (11/16 0359) Pulse Rate:  [72-110] 82 (11/16 0700) Cardiac Rhythm: Normal sinus rhythm (11/15 2000) Resp:  [12-21] 18 (11/16 0700) BP: (112-139)/(60-84) 133/84 (11/16 0700) SpO2:  [97 %-100 %] 98 % (11/16 0700) Arterial Line BP: (126)/(69) 126/69 (11/15 0800) Weight:  [157 lb 3 oz (71.3 kg)] 157 lb 3 oz (71.3 kg) (11/16 0300)  Filed Weights   10/17/16 0620 10/19/16 0300  Weight: 161 lb (73 kg) 157 lb 3 oz (71.3 kg)    Weight change:    Hemodynamic parameters for last 24 hours:    Intake/Output from previous day: 11/15 0701 - 11/16 0700 In: 720 [P.O.:480; I.V.:240] Out: 1970 [Urine:1800; Chest Tube:170]  Intake/Output this shift: No intake/output data recorded.  Current Meds: Scheduled Meds: . aspirin EC  81 mg Oral Daily  . bisacodyl  10 mg Oral Daily  . enoxaparin (LOVENOX) injection  40 mg Subcutaneous Q24H  . estradiol  1 mg Oral Daily  . fentaNYL   Intravenous Q4H  . loratadine  10 mg Oral Daily  . medroxyPROGESTERone  5 mg Oral Daily  . methocarbamol  750 mg Oral QID  . multivitamin with minerals  1 tablet Oral Daily  . pantoprazole  40 mg Oral Daily  . pregabalin  150 mg Oral BID  . senna-docusate  1 tablet Oral QHS  . venlafaxine  75 mg Oral TID WC   Continuous Infusions: . 0.45 % NaCl with KCl 20 mEq / L  10 mL/hr at 10/19/16 0700  . lactated ringers     PRN Meds:.diphenhydrAMINE **OR** diphenhydrAMINE, famotidine, levalbuterol, naloxone **AND** sodium chloride flush, ondansetron (ZOFRAN) IV, ondansetron (ZOFRAN) IV, oxyCODONE-acetaminophen, zolpidem  General appearance: alert and cooperative Neurologic: intact Heart: regular rate and rhythm, S1, S2 normal, no murmur, click, rub or gallop Lungs: clear to auscultation bilaterally Abdomen: soft, non-tender; bowel sounds normal; no masses,  no organomegaly Extremities: extremities normal, atraumatic, no cyanosis or edema and Homans sign is negative, no sign of DVT Wound: no air leak this am  Lab Results: CBC: Recent Labs  10/18/16 0439 10/19/16 0500  WBC 10.1 9.6  HGB 12.1 12.8  HCT 37.2 39.2  PLT 200 207   BMET:  Recent Labs  10/18/16 0439 10/19/16 0500  NA 139 138  K 4.4 4.1  CL 108 104  CO2 24 29  GLUCOSE 104* 117*  BUN 6 5*  CREATININE 0.62 0.64  CALCIUM 8.5* 8.9    CMET: Lab Results  Component Value Date   WBC 9.6 10/19/2016   HGB 12.8 10/19/2016   HCT 39.2 10/19/2016   PLT 207 10/19/2016   GLUCOSE 117 (H) 10/19/2016   CHOL 182 04/09/2007  TRIG 68 04/09/2007   HDL 51 04/09/2007   LDLCALC 117 04/09/2007   ALT 15 10/19/2016   AST 24 10/19/2016   NA 138 10/19/2016   K 4.1 10/19/2016   CL 104 10/19/2016   CREATININE 0.64 10/19/2016   BUN 5 (L) 10/19/2016   CO2 29 10/19/2016   INR 1.05 10/16/2016    PT/INR:  Recent Labs  10/16/16 0903  LABPROT 13.7  INR 1.05   Radiology: No results found.   Assessment/Plan: S/P Procedure(s) (LRB): VIDEO BRONCHOSCOPY (N/A) VIDEO ASSISTED THORACOSCOPY (VATS)/ MINI THORACOTOMY, LEFT UPPER LOBE WEDGE RESECTION, COMPLETION LOBECTOMY LEFT UPPER LOBE, NODE DISECTION, ON Q PLACEMENT (Left) Mobilize d/c tubes/lines Plan for transfer to step-down: see transfer orders Waiting for transfer to 3s or 2w circle  D/c posterior chest tube , remaining chest tube to water  seal Discussed path with patient   [name] 10/19/2016 7:58 AM

## 2016-10-20 ENCOUNTER — Inpatient Hospital Stay (HOSPITAL_COMMUNITY): Payer: Medicare Other

## 2016-10-20 MED ORDER — MORPHINE SULFATE (PF) 2 MG/ML IV SOLN: 2.0000 mg | Freq: Once | INTRAVENOUS | Status: AC

## 2016-10-20 MED ORDER — MORPHINE SULFATE (PF) 2 MG/ML IV SOLN
INTRAVENOUS | Status: AC
  Administered 2016-10-20: 2 mg via INTRAVENOUS
  Filled 2016-10-20: qty 1

## 2016-10-20 NOTE — Progress Notes (Signed)
Patient ID: [name], female   DOB: 11-19-1956, 60 y.o.   MRN: [id number] TCTS DAILY ICU PROGRESS NOTE                   [address]            [place] [address]          [phone]   3 Days Post-Op Procedure(s) (LRB): VIDEO BRONCHOSCOPY (N/A) VIDEO ASSISTED THORACOSCOPY (VATS)/ MINI THORACOTOMY, LEFT UPPER LOBE WEDGE RESECTION, COMPLETION LOBECTOMY LEFT UPPER LOBE, NODE DISECTION, ON Q PLACEMENT (Left)  Total Length of Stay:  LOS: 3 days   Subjective: Up walking around  Objective: Vital signs in last 24 hours: Temp:  [98.5 F (36.9 C)-99.2 F (37.3 C)] 98.7 F (37.1 C) (11/17 1100) Pulse Rate:  [81-117] 91 (11/17 1000) Cardiac Rhythm: Normal sinus rhythm;Sinus tachycardia (11/17 0800) Resp:  [13-25] 23 (11/17 1100) BP: (130)/(67-84) 130/84 (11/17 0800) SpO2:  [93 %-97 %] 95 % (11/17 1000) Weight:  [154 lb 5.2 oz (70 kg)] 154 lb 5.2 oz (70 kg) (11/17 0300)  Filed Weights   10/17/16 0620 10/19/16 0300 10/20/16 0300  Weight: 161 lb (73 kg) 157 lb 3 oz (71.3 kg) 154 lb 5.2 oz (70 kg)    Weight change: -2 lb 13.9 oz (-1.3 kg)   Hemodynamic parameters for last 24 hours:    Intake/Output from previous day: 11/16 0701 - 11/17 0700 In: 240 [I.V.:240] Out: 587 [Urine:525; [place]; Chest Tube:60]  Intake/Output this shift: Total I/O In: 40 [I.V.:40] Out: 20 [Chest Tube:20]  Current Meds: Scheduled Meds: . aspirin EC  81 mg Oral Daily  . bisacodyl  10 mg Oral Daily  . enoxaparin (LOVENOX) injection  40 mg Subcutaneous Q24H  . estradiol  1 mg Oral Daily  . loratadine  10 mg Oral Daily  . medroxyPROGESTERone  5 mg Oral Daily  . methocarbamol  750 mg Oral QID  . metoprolol tartrate  12.5 mg Oral BID  . multivitamin with minerals  1 tablet Oral Daily  . pantoprazole  40 mg Oral Daily  . pregabalin  150 mg Oral BID  . senna-docusate  1 tablet Oral QHS  . venlafaxine  75 mg Oral TID WC   Continuous Infusions: . 0.45 % NaCl with KCl 20  mEq / L 10 mL/hr at 10/20/16 1100  . lactated ringers     PRN Meds:.famotidine, levalbuterol, ondansetron (ZOFRAN) IV, oxyCODONE-acetaminophen, zolpidem  General appearance: alert and cooperative Neurologic: intact Heart: regular rate and rhythm, S1, S2 normal, no murmur, click, rub or gallop Lungs: diminished breath sounds bibasilar Abdomen: soft, non-tender; bowel sounds normal; no masses,  no organomegaly Extremities: extremities normal, atraumatic, no cyanosis or edema and Homans sign is negative, no sign of DVT Wound: no air leak   Lab Results: CBC:  Recent Labs  10/18/16 0439 10/19/16 0500  WBC 10.1 9.6  HGB 12.1 12.8  HCT 37.2 39.2  PLT 200 207   BMET:   Recent Labs  10/18/16 0439 10/19/16 0500  NA 139 138  K 4.4 4.1  CL 108 104  CO2 24 29  GLUCOSE 104* 117*  BUN 6 5*  CREATININE 0.62 0.64  CALCIUM 8.5* 8.9    CMET: Lab Results  Component Value Date   WBC 9.6 10/19/2016   HGB 12.8 10/19/2016   HCT 39.2 10/19/2016   PLT 207 10/19/2016   GLUCOSE 117 (H) 10/19/2016   CHOL 182 04/09/2007   TRIG 68 04/09/2007  HDL 51 04/09/2007   LDLCALC 117 04/09/2007   ALT 15 10/19/2016   AST 24 10/19/2016   NA 138 10/19/2016   K 4.1 10/19/2016   CL 104 10/19/2016   CREATININE 0.64 10/19/2016   BUN 5 (L) 10/19/2016   CO2 29 10/19/2016   INR 1.05 10/16/2016    PT/INR: No results for input(s): LABPROT, INR in the last 72 hours. Radiology: Dg Chest Port 1 View  Result Date: 10/20/2016 CLINICAL DATA:  Followup left chest tube. Status post left lobectomy. EXAM: PORTABLE CHEST 1 VIEW COMPARISON:  10/19/2016 FINDINGS: Remaining left chest to is stable with its tip projecting just above and lateral to the aortic knob. No pneumothorax. Lungs are clear.  No convincing pleural effusion. Right internal jugular central venous line is stable. IMPRESSION: 1. No acute findings.  Left lung is clear. 2. No pneumothorax. Remaining support apparatus is stable and well positioned.  Electronically Signed   By: Lajean Manes M.D.   On: 10/20/2016 09:54     Assessment/Plan: S/P Procedure(s) (LRB): VIDEO BRONCHOSCOPY (N/A) VIDEO ASSISTED THORACOSCOPY (VATS)/ MINI THORACOTOMY, LEFT UPPER LOBE WEDGE RESECTION, COMPLETION LOBECTOMY LEFT UPPER LOBE, NODE DISECTION, ON Q PLACEMENT (Left) Mobilize d/c tubes/lines Plan for transfer to step-down: see transfer orders D/c remaining  chest tube  Poss home in am    [name] 10/20/2016 12:10 PM

## 2016-10-20 NOTE — Discharge Summary (Signed)
Physician Discharge Summary  Patient ID: [name] MRN: [id number] DOB/AGE: August 15, 1956 60 y.o.  Admit date: 10/17/2016 Discharge date: 10/21/2016  Admission Diagnoses:  Patient Active Problem List   Diagnosis Date Noted  . Coronary artery calcification seen on CAT scan 10/02/2016  . Preoperative cardiovascular examination 10/02/2016  . Lung cancer, left upper lobe ([place]) 09/11/2016  . Dyspnea 08/28/2016  . Chest pain 08/28/2016  . COPD mixed type ([place]) 08/28/2016  . Tobacco use disorder 08/28/2016  . Family history of premature CAD 04/08/2014  . Lipid disorder 04/08/2014  . Myalgia and myositis, unspecified 04/18/2013  . Cervical spondylosis without myelopathy 04/18/2013  . CHEST WALL PAIN, ACUTE 10/22/2007  . BRONCHITIS NOS 08/23/2007  . DEPRESSION 08/10/2007  . SYNDROME, CHRONIC PAIN 08/10/2007  . MIGRAINE HEADACHE 08/10/2007  . Paroxysmal atrial tachycardia ([place]) 08/10/2007  . CONSTIPATION 08/10/2007  . DEGENERATIVE DISC DISEASE, CERVICAL SPINE 08/10/2007  . Dizziness and giddiness 08/10/2007  . CARPAL TUNNEL SYNDROME, BILATERAL 04/09/2007  . MAMMOGRAM, ABNORMAL, LEFT 02/27/2007  . ABNORMAL PAP SMEAR, LGSIL 02/24/2004   Discharge Diagnoses:   Patient Active Problem List   Diagnosis Date Noted  . S/P lobectomy of lung 10/17/2016  . Coronary artery calcification seen on CAT scan 10/02/2016  . Preoperative cardiovascular examination 10/02/2016  . Lung cancer, left upper lobe ([place]) 09/11/2016  . Dyspnea 08/28/2016  . Chest pain 08/28/2016  . COPD mixed type ([place]) 08/28/2016  . Tobacco use disorder 08/28/2016  . Family history of premature CAD 04/08/2014  . Lipid disorder 04/08/2014  . Myalgia and myositis, unspecified 04/18/2013  . Cervical spondylosis without myelopathy 04/18/2013  . CHEST WALL PAIN, ACUTE 10/22/2007  . BRONCHITIS NOS 08/23/2007  . DEPRESSION 08/10/2007  . SYNDROME, CHRONIC PAIN 08/10/2007  . MIGRAINE HEADACHE 08/10/2007  .  Paroxysmal atrial tachycardia ([place]) 08/10/2007  . CONSTIPATION 08/10/2007  . DEGENERATIVE DISC DISEASE, CERVICAL SPINE 08/10/2007  . Dizziness and giddiness 08/10/2007  . CARPAL TUNNEL SYNDROME, BILATERAL 04/09/2007  . MAMMOGRAM, ABNORMAL, LEFT 02/27/2007  . ABNORMAL PAP SMEAR, LGSIL 02/24/2004   Discharged Condition: good  History of Present Illness:  [name] is a 60 who was referred to Dr. Servando Snare for evaluation of a lung nodule.  She was undergoing treatment for costochondritis and chest discomfort.  CXR obtained identified evidence of a pulmonary nodule.  She was referred to Pulmonary who felt patient should have a CT scan to assess the risk of possible lung cancer.  This was done and revealed a spiculated mass in the left upper lobe.  It was felt this could likely be a pulmonary cancer and further evaluation with PET CT scan was recommended.  This was performed and showed the lesion to be hypermetabolic.  She was evaluated by Dr. Servando Snare who recommended she undergo needle biopsy of her cervical lymph nodes identified on CT scan.  This was done and was negative.  It was felt the patient should undergo surgical resection.  The risks and benefits of the procedure were explained to the patient and she was agreeable to proceed.  Hospital Course:   [name] presented to [facility] on 10/17/2016.  She was taken to the operating room and underwent Left Vats with wedge resection of left upper lobe, completion lobectomy of left upper lobe, lymph node dissection, and placement of ON-Q pain catheter.  She tolerated the procedure without difficulty, was extubated and taken to the SICU in stable condition.  The patient has progressed without much difficulty post operatively.  Her CXR  was free from significant pneumothorax.  Her posterior chest tube was removed on POD #2.  Her remaining chest tube was left in place and transitioned to water seal.  Follow up CXR remained stable  Her  final chest tube was removed on POD #3.  Repeat CXR showed development of 5% pneumothorax.  The patient was asymptomatic.  She was maintaining good oxygenation levels.  She was ambulating independently.  Her pain is controlled on her home pain medication regimen.  She is felt medically stable for discharge home today.  Significant Diagnostic Studies: Pathology  Diagnosis 1. Lung, wedge biopsy/resection, Left upper lobe - ADENOCARCINOMA, 2.4 CM. - MARGINS NOT INVOLVED. 2. Lung, resection (segmental or lobe), Left upper lobe - FINDINGS CONSISTENT WITH PREVIOUS WEDGE BIOPSY. - NO RESIDUAL ADENOCARCINOMA. - MARGINS FREE OF TUMOR. 3. Lymph node, biopsy, 10 L - ANTHRACOTIC LYMPH NODE. - NO TUMOR IDENTIFIED. 4. Lymph node, biopsy, 11 L - ANTHRACOTIC LYMPH NODE. - NO TUMOR IDENTIFIED. 5. Lymph node, biopsy, 12 L - ANTHRACOTIC LYMPH NODE. - NO TUMOR IDENTIFIED. 6. Lymph node, biopsy, 10 L #2 - ANTHRACOTIC LYMPH NODE. - NO TUMOR IDENTIFIED. 7. Lymph node, biopsy, 4 L - ANTHRACOTIC LYMPH NODE. - NO TUMOR IDENTIFIED. 8. Lymph node, biopsy, 5 - ANTHRACOTIC LYMPH NODE. - NO TUMOR IDENTIFIED. 9. Lymph node, biopsy, 6 - ANTHRACOTIC LYMPH NODE. - NO TUMOR IDENTIFIED. 10. Lymph node, biopsy, 11 L - ANTHRACOTIC LYMPH NODE. 1 of 4 FINAL for [name], [name] L ([id number]) Diagnosis(continued) - NO TUMOR IDENTIFIED. 11. Lymph node, biopsy, 12 L - ANTHRACOTIC LYMPH NODE. - NO TUMOR IDENTIFIED.  Treatments: surgery:   VIDEO BRONCHOSCOPY (N/A)  VIDEO ASSISTED THORACOSCOPY (VATS)/ MINI THORACOTOMY, -Wedge Resection Left Upper Lobe -Completion Lobectomy Left Upper Lobe -Lymph Node Dissection -Insertion of On-Q Pain Catheter  Disposition: 01-Home or Self Care   Discharge Medications:      Medication List    TAKE these medications   aspirin EC 81 MG tablet Take 81 mg by mouth daily.   CALCIUM 1200 PO Take 1,200 mg by mouth 2 (two) times daily.   cetirizine 10 MG  tablet Commonly known as:  ZYRTEC Take 10 mg by mouth daily as needed for allergies (alternates between zyrtec and claritin weekly).   DEXILANT 60 MG capsule Generic drug:  dexlansoprazole Take 60 mg by mouth daily.   diphenhydrAMINE 25 MG tablet Commonly known as:  BENADRYL Take 25 mg by mouth every 6 (six) hours as needed for allergies.   docusate sodium 100 MG capsule Commonly known as:  COLACE Take 100 mg by mouth 2 (two) times daily as needed for constipation.   estradiol 1 MG tablet Commonly known as:  ESTRACE Take 1 mg by mouth daily.   FIORICET 50-300-40 MG Caps Generic drug:  Butalbital-APAP-Caffeine 1 to 2 every 4-6 hrs as needed What changed:  how much to take  how to take this  when to take this  reasons to take this  additional instructions   Ginger Root 550 MG Caps Take 550 mg by mouth 2 (two) times daily as needed (nausea).   loratadine 10 MG tablet Commonly known as:  CLARITIN Take 10 mg by mouth daily as needed for allergies (Alternates between zyrtec and claritin weekly).   medroxyPROGESTERone 5 MG tablet Commonly known as:  PROVERA Take 5 mg by mouth daily.   methocarbamol 750 MG tablet Commonly known as:  ROBAXIN Take 750 mg by mouth 4 (four) times daily.   metoprolol tartrate 25 MG tablet  Commonly known as:  LOPRESSOR Take 0.5 tablets (12.5 mg total) by mouth 2 (two) times daily.   multivitamin with minerals Tabs tablet Take 1 tablet by mouth daily.   oxyCODONE-acetaminophen 7.5-325 MG tablet Commonly known as:  PERCOCET Take 1 tablet by mouth every 4 (four) hours as needed for moderate pain.   pregabalin 150 MG capsule Commonly known as:  LYRICA Take 150 mg by mouth 2 (two) times daily.   pyridoxine 100 MG tablet Commonly known as:  B-6 Take 100 mg by mouth 2 (two) times daily.   ranitidine 150 MG tablet Commonly known as:  ZANTAC Take 150 mg by mouth 2 (two) times daily as needed. If dexilant doesn't work   Tiotropium  Bromide-Olodaterol 2.5-2.5 MCG/ACT Aers Commonly known as:  STIOLTO RESPIMAT Inhale 2 puffs into the lungs daily.   TURMERIC PO Take 550 mg by mouth 3 (three) times daily.   venlafaxine 75 MG tablet Commonly known as:  EFFEXOR Take 75 mg by mouth 3 (three) times daily with meals.   vitamin C 1000 MG tablet Take 1,000 mg by mouth daily.   zolpidem 10 MG tablet Commonly known as:  AMBIEN Take 10 mg by mouth at bedtime as needed for sleep.          Follow-up Information    [name], MD Follow up on 11/02/2016.   Specialty:  Cardiothoracic Surgery Why:  Appointment is at 9:00 Contact information: [address] [place] [address] [phone]           Signed: Ellwood Handler 10/21/2016, 3:43 PM

## 2016-10-20 NOTE — Discharge Instructions (Signed)
Thoracotomy, Care After ° °This sheet gives you information about how to care for yourself after your procedure. Your health care provider may also give you more specific instructions. If you have problems or questions, contact your health care provider. °What can I expect after the procedure? °After your procedure, it is common to have: °· Pain and swelling around the incision area. °· Pain when you breathe in (inhale). °· Constipation. °· Fatigue. °· Loss of appetite. °· Trouble sleeping. °· Mood swings and depression. ° °Follow these instructions at home: °Preventing pneumonia °· Take deep breaths or do breathing exercises as instructed by your health care provider. °· Cough frequently. Coughing may cause discomfort, but it is important to clear mucus (phlegm) and expand your lungs. If coughing hurts, hold a pillow against your chest or place both hands flat on top of the incision (splinting) when you cough. This may help relieve discomfort. °· Continue to use an incentive spirometer as directed. This is a tool that measures how well you fill your lungs with each breath. °· Participate in pulmonary rehabilitation as directed. This is a program that combines education, exercise, and support from a team of specialists. The goal is to help you heal and return to normal activities as soon as possible. °Medicines °· Take over-the-counter or prescription medicines only as told by your health care provider. °· If you have pain, take pain-relieving medicine before your pain becomes severe. This is important because if your pain is under control, you will be able to breathe and cough more comfortably. °· If you were prescribed an antibiotic medicine, take it as told by your health care provider. Do not stop taking the antibiotic even if you start to feel better. °Activity °· Ask your health care provider what activities are safe for you. °· Do not travel by airplane for 2 weeks after your chest tube is removed, or until  your health care provider says that this is safe. °· Do not lift anything that is heavier than 10 lb (4.5 kg), or the limit that your health care provider tells you, until he or she says that it is safe. °· Do not drive until your health care provider approves. °? Do not drive or use heavy machinery while taking prescription pain medicine. °Incision care °· Follow instructions from your health care provider about how to take care of your incision. Make sure you: °? Wash your hands with soap and water before you change your bandage (dressing). If soap and water are not available, use hand sanitizer. °? Change your dressing as told by your health care provider. °? Leave stitches (sutures), skin glue, or adhesive strips in place. These skin closures may need to stay in place for 2 weeks or longer. If adhesive strip edges start to loosen and curl up, you may trim the loose edges. Do not remove adhesive strips completely unless your health care provider tells you to do that. °· Keep your dressing dry. °· Check your incision area every day for signs of infection. Check for: °? More redness, swelling, or pain. °? More fluid or blood. °? Warmth. °? Pus or a bad smell. °Bathing °· Do not take baths, swim, or use a hot tub until your health care provider approves. You may take showers. °· After your dressing has been removed, use soap and water to gently wash your incision area. Do not use anything else to clean your incision unless your health care provider tells you to do that. °Eating   and drinking °· Eat a healthy diet as instructed by your health care provider. A healthy diet includes plenty of fresh fruits and vegetables, whole grains, and low-fat (lean) proteins. °· Drink enough fluid to keep your urine clear or pale yellow. °General instructions °· To prevent or treat constipation while you are taking prescription pain medicine, your health care provider may recommend that you: °? Take over-the-counter or prescription  medicines. °? Eat foods that are high in fiber, such as fresh fruits and vegetables, whole grains, and beans. °? Limit foods that are high in fat and processed sugars, such as fried and sweet foods. °· Do not use any products that contain nicotine or tobacco, such as cigarettes and e-cigarettes. If you need help quitting, ask your health care provider. °· Avoid secondhand smoke. °· Wear compression stockings as told by your health care provider. These stockings help to prevent blood clots and reduce swelling in your legs. °· If you have a chest tube, care for it as instructed. °· Keep all follow-up visits as told by your health care provider. This is important. °Contact a health care provider if: °· You have more redness, swelling, or pain around your incision. °· You have more fluid or blood coming from your incision. °· Your incision feels warm to the touch. °· You have pus or a bad smell coming from your incision. °· You have a fever or chills. °· Your heartbeat seems irregular. °· You have nausea or vomiting. °· You have muscle aches. °· You are constipated. This may mean that you have: °? Fewer bowel movements in a week than normal. °? Difficulty having a bowel movement. °? Stools that are dry, hard, or larger than normal. °Get help right away if: °· You develop a rash. °· You feel light-headed or feel like you are going to faint. °· You have shortness of breath or trouble breathing. °· You are confused. °· You have trouble speaking. °· You have vision problems. °· You are not able to move. °· You have numbness in your face, arms, or legs. °· You lose consciousness. °· You have a sudden, severe headache. °· You feel weak. °· You have chest pain. °· You have pain that: °? Is severe. °? Gets worse, even with medicine. °Summary °· To prevent pneumonia, take deep breaths, do breathing exercises, and cough frequently, as instructed by your health care provider. °· Do not drive until your health care provider  approves. Do not travel by airplane for 2 weeks after your chest tube is removed, or until your health care provider approves. °· Check your incision area every day for signs of infection. °· Eat a healthy diet that includes plenty of fresh fruits and vegetables, whole grains, and low-fat (lean) proteins. °This information is not intended to replace advice given to you by your health care provider. Make sure you discuss any questions you have with your health care provider. °Document Released: 05/05/2011 Document Revised: 08/14/2016 Document Reviewed: 08/14/2016 °Elsevier Interactive Patient Education © 2017 [employer] ° °

## 2016-10-21 ENCOUNTER — Inpatient Hospital Stay (HOSPITAL_COMMUNITY): Payer: Medicare Other

## 2016-10-21 MED ORDER — METOPROLOL TARTRATE 25 MG PO TABS
12.5000 mg | ORAL_TABLET | Freq: Two times a day (BID) | ORAL | 3 refills | Status: DC
Start: 2016-10-21 — End: 2016-11-02

## 2016-10-21 MED ORDER — ZOLPIDEM TARTRATE 5 MG PO TABS: 10.0000 mg | ORAL_TABLET | Freq: Every evening | ORAL | Status: DC | PRN

## 2016-10-21 NOTE — Progress Notes (Addendum)
Pt/family given discharge instructions, medication lists, follow up appointments, and when to call the doctor.  Pt/family verbalizes understanding. Removed dressings from neck and CT suture site. Patient aware of look of normal incision.  Pt given signs and symptoms of infection. Payton Emerald, RN

## 2016-10-21 NOTE — Progress Notes (Signed)
Radiology called with results of CXR.  Small 5-10% left pneumothorax.  Will notify on call PA. Pt resting with call bell within reach.  Will continue to monitor. Payton Emerald, RN

## 2016-10-21 NOTE — Op Note (Signed)
[name], [name] NO.:  [id number]  MEDICAL RECORD NO.:  [id number]  LOCATION:  [id number]                        FACILITY:  [facility]  PHYSICIAN:  Lanelle Bal, MD    DATE OF BIRTH:  [birth year]  DATE OF PROCEDURE:  10/17/2016 DATE OF DISCHARGE:                              OPERATIVE REPORT   PREOPERATIVE DIAGNOSIS:  Left upper lobe lung mass.  POSTOPERATIVE DIAGNOSIS:  Adenocarcinoma of the left upper lobe.  PROCEDURE PERFORMED:  Video bronchoscopy, left video-assisted thoracoscopy, mini thoracotomy, wedge resection of left upper lobe, completion of left upper lobectomy with lymph node dissection and placement of On-Q device, and lymph node dissection.  SURGEON:  Lanelle Bal, M.D.  FIRST ASSISTANT:  Providence Crosby, PA.  BRIEF HISTORY:  The patient is a 60 year old female with long history of smoking who presented with abnormal chest x-ray with left upper lobe lung mass.  The patient was evaluated with a CT scan and PET scan, pulmonary function studies and cardiac clearance were suitable for surgical resection of the lesion highly suspicious for malignancy.  The risks and options were discussed with the patient in detail who understood the likely diagnosis of carcinoma of the lung.  She agreed to and signed informed consent.  DESCRIPTION OF PROCEDURE:  The patient with the left side marked preoperatively underwent general endotracheal anesthesia with a single- lumen endotracheal tube.  Appropriate time-out was performed.  Then, we proceeded with a video bronchoscopy with 2.8-mm video bronchoscope up to the subsegmental level on both the left and right tracheobronchial tree without evidence of endobronchial lesions.  The scope was removed.  The patient was then turned in lateral decubitus position with the left side up.  A double-lumen endotracheal tube had been placed, the left lung was collapsed, the left chest was prepped with Betadine and draped  in sterile manner.  A second time-out was performed and then we proceeded with a small incision in the mid axillary line approximately at the fourth intercostal space.  A 30-degree videoscope was placed into the chest.  There was adequate collapse of the lung.  Some adhesions to the lung were taken down freeing the upper lobe.  The upper lobe was inspected though the known lesion was not readily apparent.  We removed the scope and enlarged the incision to approximately 4 cm to work through.  Additional port sites were made more inferiorly.  Through the incision, we could manipulate the lung and palpate and identified the lesion.  A wedge resection with staplers were performed on the upper portion of the lung and submitted to Pathology.  Frozen section on this confirmed adenocarcinoma of the lung.  We then proceeded with completion of pneumonectomy.  The fissure was fairly well developed.  We through the incision with the port sites dissected around the left upper lobe pulmonary vein encircled this with a vessel loop.  This vessel was then divided with a vascular stapler.  We continued our dissection along the pulmonary artery and 3 separate pulmonary artery branches to the upper lobe were identified and each was divided with a vascular stapler.  This left good visualization of the left upper lobe bronchus.  A green load Ethicon stapler was used to place  across this bronchus.  The inflation of the lower lobe was tested and intact.  The bronchus was divided.  The remnants of the fissure were completed with a stapler.  The specimen was placed in a specimen bag and brought out through incisions submitted to Pathology.  The bronchial margins were negative on frozen section.  We then proceeded with lymph node dissection at multiple levels each labeled appropriately for each station.  An On-Q device was tunneled subpleurally along the posterior ribs.  Through the 2 port sites, a Blake drain was  placed posteriorly and a standard chest tube was placed anteriorly both 28-French.  The lung was re-inflated.  The ribs were approximated through a small drill hole in the lower rib and a pericostal suture.  The incision was closed with interrupted 0 Vicryl, running 2-0 Vicryl in subcutaneous tissue, subcuticular stitch in skin edges, the incision itself was no more than 4 cm.  Dermabond was applied.  The lung re-inflated.  There was a very minimal air leak at the completion of procedure.  The patient was extubated in the operating room having tolerated the procedure without obvious complications. Sponge and needle count was reported as correct at the completion of procedure.  Blood loss was 100-125 mL.  lymph node dissection.  Surgeon was Dr. Servando Snare 1st assistant was Providence Crosby PA thinking.     Lanelle Bal, MD     EG/MEDQ  D:  10/20/2016  T:  10/21/2016  Job:  [id number]

## 2016-10-21 NOTE — Progress Notes (Addendum)
      [address]       [place] [address]             [phone]    4 Days Post-Op Procedure(s) (LRB): VIDEO BRONCHOSCOPY (N/A) VIDEO ASSISTED THORACOSCOPY (VATS)/ MINI THORACOTOMY, LEFT UPPER LOBE WEDGE RESECTION, COMPLETION LOBECTOMY LEFT UPPER LOBE, NODE DISECTION, ON Q PLACEMENT (Left)   Subjective:  No new complaints.  Continues to have some discomfort/burning at incision site.  Really wants to go home.  Ambulating independently.  Objective: Vital signs in last 24 hours: Temp:  [98.4 F (36.9 C)-98.8 F (37.1 C)] 98.4 F (36.9 C) (11/18 0500) Pulse Rate:  [79-117] 98 (11/18 0500) Cardiac Rhythm: Normal sinus rhythm;Sinus tachycardia (11/17 1950) Resp:  [14-25] 18 (11/18 0500) BP: (91-120)/(63-80) 111/63 (11/18 0500) SpO2:  [95 %-98 %] 97 % (11/18 0500)  Intake/Output from previous day: 11/17 0701 - 11/18 0700 In: 40 [I.V.:40] Out: 20 [Chest Tube:20]  General appearance: alert, cooperative and no distress Heart: regular rate and rhythm Lungs: clear to auscultation bilaterally Abdomen: soft, non-tender; bowel sounds normal; no masses,  no organomegaly Extremities: extremities normal, atraumatic, no cyanosis or edema Wound: clean and dry  Lab Results:  Recent Labs  10/19/16 0500  WBC 9.6  HGB 12.8  HCT 39.2  PLT 207   BMET:  Recent Labs  10/19/16 0500  NA 138  K 4.1  CL 104  CO2 29  GLUCOSE 117*  BUN 5*  CREATININE 0.64  CALCIUM 8.9    PT/INR: No results for input(s): LABPROT, INR in the last 72 hours. ABG    Component Value Date/Time   PHART 7.370 10/18/2016 0436   HCO3 27.7 10/18/2016 0436   TCO2 29 10/18/2016 0436   ACIDBASEDEF 4.0 (H) 10/17/2016 1335   O2SAT 98.0 10/18/2016 0436   CBG (last 3)  No results for input(s): GLUCAP in the last 72 hours.  Assessment/Plan: S/P Procedure(s) (LRB): VIDEO BRONCHOSCOPY (N/A) VIDEO ASSISTED THORACOSCOPY (VATS)/ MINI THORACOTOMY, LEFT UPPER LOBE WEDGE RESECTION, COMPLETION  LOBECTOMY LEFT UPPER LOBE, NODE DISECTION, ON Q PLACEMENT (Left)  1. Pulm- CXR with new 5-10% pneumothorax post chest tube removal 2. Chronic pain- continue home regimen of medications 3. CV- NSR, continue home lopressor 4. Dispo- patient stable, new minimal pneumothorax post chest tube removal, patient wants to go home will discuss with Dr. Roxy Manns    LOS: 4 days    BARRETT, Junie Panning 10/21/2016   I have seen and examined the patient and agree with the assessment and plan as outlined.  CXR okay.  D/C home  Rexene Alberts, MD 10/21/2016 10:50 AM

## 2016-10-30 ENCOUNTER — Other Ambulatory Visit: Payer: Self-pay

## 2016-10-30 DIAGNOSIS — C3492 Malignant neoplasm of unspecified part of left bronchus or lung: Secondary | ICD-10-CM

## 2016-11-02 ENCOUNTER — Ambulatory Visit (INDEPENDENT_AMBULATORY_CARE_PROVIDER_SITE_OTHER): Payer: Self-pay | Admitting: Cardiothoracic Surgery

## 2016-11-02 ENCOUNTER — Ambulatory Visit
Admission: RE | Admit: 2016-11-02 | Discharge: 2016-11-02 | Disposition: A | Discharge status: Home / Self Care | Payer: Medicare Other | Source: Ambulatory Visit | Attending: Cardiothoracic Surgery | Admitting: Cardiothoracic Surgery

## 2016-11-02 ENCOUNTER — Encounter: Payer: Self-pay | Admitting: Cardiothoracic Surgery

## 2016-11-02 VITALS — BP 103/64 | HR 70 | Resp 16 | Ht 67.0 in | Wt 163.0 lb

## 2016-11-02 DIAGNOSIS — C801 Malignant (primary) neoplasm, unspecified: Secondary | ICD-10-CM

## 2016-11-02 DIAGNOSIS — C3490 Malignant neoplasm of unspecified part of unspecified bronchus or lung: Secondary | ICD-10-CM | POA: Diagnosis not present

## 2016-11-02 DIAGNOSIS — Z902 Acquired absence of lung [part of]: Secondary | ICD-10-CM

## 2016-11-02 DIAGNOSIS — C3492 Malignant neoplasm of unspecified part of left bronchus or lung: Secondary | ICD-10-CM

## 2016-11-02 NOTE — Progress Notes (Signed)
[address]       [place] [address]             [phone]      [name] [facility] Medical Record #[id number] Date of Birth: Sep 20, 1956  Referring: Juanito Doom, MD Primary Care: Horatio Pel, MD  Chief Complaint:   POST OP FOLLOW UP 10/17/2016  OPERATIVE REPORT PREOPERATIVE DIAGNOSIS:  Left upper lobe lung mass. POSTOPERATIVE DIAGNOSIS:  Adenocarcinoma of the left upper lobe. PROCEDURE PERFORMED:  Video bronchoscopy, left video-assisted thoracoscopy, mini thoracotomy, wedge resection of left upper lobe, completion of left upper lobectomy with lymph node dissection and placement of On-Q device, and lymph node dissection. SURGEON:  Lanelle Bal, M.D.  Lung cancer, left upper lobe ([place])   Staging form: Lung, AJCC 7th Edition   - Pathologic stage from 10/17/2016: Stage IA (T1b, N0, cM0) - Signed by [name], MD on 10/18/2016   History of Present Illness:     Patient returns to the office today after recent left upper lobectomy for stage I a adenocarcinoma of the lung 2.4 cm in size. Patient has made good progress postoperatively she's had some skin irritation related to the Dermabond placed on the incision postoperatively. Her respiratory status has been good. She remains smoke free.     Past Medical History:  Diagnosis Date  . ADD (attention deficit disorder)   . Carotid stenosis   . Carpal tunnel syndrome on both sides   . Cervical spondylosis without myelopathy   . COPD (chronic obstructive pulmonary disease) ([place])   . Costochondritis   . Depression   . Fibromyalgia   . GERD (gastroesophageal reflux disease)   . Heart murmur    dx 20-30 yrs ago.    . Lumbosacral spondylosis without myelopathy   . Lung cancer, left upper lobe ([place]) 09/11/2016   09/2016 PET CT > spiculated left upper lobe nodule, no mediastinal lymphadenopathy, but some CERVICAL lymphadenopathy of undetermined significance  . Migraine     has them 12-15 days a month  . Mitral valve prolapse   . MRSA (methicillin resistant staph aureus) culture positive   . MRSA (methicillin resistant Staphylococcus aureus)    was in her perirectal abscess  back in 2006  . Myalgia and myositis, unspecified   . Palpitations   . Paroxysmal supraventricular tachycardia ([place])   . Pneumonia    of last yr (2016)  . SVT (supraventricular tachycardia) ([place])    very brief runs  . Tobacco abuse      History  Smoking Status  . Current Every Day Smoker  . Packs/day: 1.50  . Types: E-cigarettes  Smokeless Tobacco  . Never Used    Comment: ONLY USING VAPE     History  Alcohol Use No     Allergies  Allergen Reactions  . Carafate [Sucralfate] Hives and Other (See Comments)    CHEST PAIN    . Cefaclor Hives and Other (See Comments)    CHEST PAIN    . Depakote [Divalproex Sodium] Hives and Other (See Comments)    CHEST PAIN    . Nitrofurantoin Hives    CHEST PAIN   . Sulfamethoxazole Hives    CHEST PAIN    . Wellbutrin [Bupropion] Hives and Other (See Comments)    CHEST PAIN     Current Outpatient Prescriptions  Medication Sig Dispense Refill  . Ascorbic Acid (VITAMIN C) 1000 MG tablet Take 1,000 mg by mouth daily.     Marland Kitchen  aspirin EC 81 MG tablet Take 81 mg by mouth daily.    . Calcium Carbonate-Vit D-Min (CALCIUM 1200 PO) Take 1,200 mg by mouth 2 (two) times daily.    . cetirizine (ZYRTEC) 10 MG tablet Take 10 mg by mouth daily as needed for allergies (alternates between zyrtec and claritin weekly).     Marland Kitchen dexlansoprazole (DEXILANT) 60 MG capsule Take 60 mg by mouth daily.    . diphenhydrAMINE (BENADRYL) 25 MG tablet Take 25 mg by mouth every 6 (six) hours as needed for allergies.     Marland Kitchen docusate sodium (COLACE) 100 MG capsule Take 100 mg by mouth 2 (two) times daily as needed for constipation.    Marland Kitchen estradiol (ESTRACE) 1 MG tablet Take 1 mg by mouth daily.    Marland Kitchen FIORICET 50-300-40 MG CAPS 1 to 2 every 4-6 hrs as needed (Patient  taking differently: Take 1-2 tablets by mouth every 4 (four) hours as needed (migraines). ) 60 capsule 0  . Ginger, Zingiber officinalis, (GINGER ROOT) 550 MG CAPS Take 550 mg by mouth 2 (two) times daily as needed (nausea).     . loratadine (CLARITIN) 10 MG tablet Take 10 mg by mouth daily as needed for allergies (Alternates between zyrtec and claritin weekly).     . medroxyPROGESTERone (PROVERA) 5 MG tablet Take 5 mg by mouth daily.     . methocarbamol (ROBAXIN) 750 MG tablet Take 750 mg by mouth 4 (four) times daily.    . metoprolol tartrate (LOPRESSOR) 25 MG tablet Take 0.5 tablets (12.5 mg total) by mouth 2 (two) times daily. 60 tablet 3  . Multiple Vitamin (MULTIVITAMIN WITH MINERALS) TABS Take 1 tablet by mouth daily.    Marland Kitchen oxyCODONE-acetaminophen (PERCOCET) 7.5-325 MG per tablet Take 1 tablet by mouth every 4 (four) hours as needed for moderate pain.     . pregabalin (LYRICA) 150 MG capsule Take 150 mg by mouth 2 (two) times daily.    Marland Kitchen pyridoxine (B-6) 100 MG tablet Take 100 mg by mouth 2 (two) times daily.    . ranitidine (ZANTAC) 150 MG tablet Take 150 mg by mouth 2 (two) times daily as needed. If dexilant doesn't work    . Tiotropium Bromide-Olodaterol (STIOLTO RESPIMAT) 2.5-2.5 MCG/ACT AERS Inhale 2 puffs into the lungs daily. 1 Inhaler 0  . TURMERIC PO Take 550 mg by mouth 3 (three) times daily.     Marland Kitchen venlafaxine (EFFEXOR) 75 MG tablet Take 75 mg by mouth 3 (three) times daily with meals.    Marland Kitchen zolpidem (AMBIEN) 10 MG tablet Take 10 mg by mouth at bedtime as needed for sleep.     No current facility-administered medications for this visit.        Physical Exam: BP 103/64 (BP Location: Right Arm, Patient Position: Sitting, Cuff Size: Normal)   Pulse 70   Resp 16   Ht '5\' 7"'$  (1.702 m)   Wt 163 lb (73.9 kg)   LMP 09/07/2006 (Within Years) Comment: >10 years  SpO2 96% Comment: ON RA  BMI 25.53 kg/m   General appearance: alert and cooperative Neurologic: intact Heart: regular  rate and rhythm, S1, S2 normal, no murmur, click, rub or gallop Lungs: clear to auscultation bilaterally Abdomen: soft, non-tender; bowel sounds normal; no masses,  no organomegaly Extremities: extremities normal, atraumatic, no cyanosis or edema and Homans sign is negative, no sign of DVT Wound: Patient has some superficial skin irritation around the incision in the distribution of the Dermabond, this was completely removed  today in office. There is no evidence of infection   Diagnostic Studies & Laboratory data:     Recent Radiology Findings:   Dg Chest 2 View  Result Date: 11/02/2016 CLINICAL DATA:  Lung cancer.  History of VATS. EXAM: CHEST  2 VIEW COMPARISON:  10/21/2016 . FINDINGS: Mediastinum and hilar structures are stable. Heart size stable. Postsurgical changes left lung. Previous identified left apical left upper lobe pneumothorax has resolved. Tiny focal air collection noted over the left upper lung may represent a small collection of parenchymal or pleural air following surgery. Follow-up exam suggested . Right lung is clear. Heart size normal. IMPRESSION: 1. Postsurgical changes left lung. Interim resolution of previously identified left apical pneumothorax. 2. There is a tiny collection of air noted over the left upper lobe, this may be postsurgical parenchymal or pleural air and continued follow-up chest x-rays suggested. Electronically Signed   By: Marcello Moores  Register   On: 11/02/2016 08:54      Recent Lab Findings: Lab Results  Component Value Date   WBC 9.6 10/19/2016   HGB 12.8 10/19/2016   HCT 39.2 10/19/2016   PLT 207 10/19/2016   GLUCOSE 117 (H) 10/19/2016   CHOL 182 04/09/2007   TRIG 68 04/09/2007   HDL 51 04/09/2007   LDLCALC 117 04/09/2007   ALT 15 10/19/2016   AST 24 10/19/2016   NA 138 10/19/2016   K 4.1 10/19/2016   CL 104 10/19/2016   CREATININE 0.64 10/19/2016   BUN 5 (L) 10/19/2016   CO2 29 10/19/2016   INR 1.05 10/16/2016      Assessment / Plan:      Patient doing well following recent left upper lobectomy for stage I a adenocarcinoma of the lung Findings will be presented at the multidisciplinary thoracic oncology conference, but with stage IA disease radiation or chemotherapy would not be indicated. We'll continue to follow the patient, chest x-ray in 2-3 months and CT scan of the chest 6 months postop The patient is very proud of the fact that she's remained smoke free. She was started on low dosed Lopressor while in the hospital for mild elevation of heart rate and blood pressure , she will taper off this over the next couple days and DC the medication.    [name] MD      [address] [place] [address] Office [phone]   Beeper [phone]  11/02/2016 9:41 AM

## 2016-11-03 DIAGNOSIS — Z09 Encounter for follow-up examination after completed treatment for conditions other than malignant neoplasm: Secondary | ICD-10-CM | POA: Diagnosis not present

## 2016-11-03 DIAGNOSIS — G8929 Other chronic pain: Secondary | ICD-10-CM | POA: Diagnosis not present

## 2016-11-03 DIAGNOSIS — K219 Gastro-esophageal reflux disease without esophagitis: Secondary | ICD-10-CM | POA: Diagnosis not present

## 2016-11-03 DIAGNOSIS — G43909 Migraine, unspecified, not intractable, without status migrainosus: Secondary | ICD-10-CM | POA: Diagnosis not present

## 2016-11-10 DIAGNOSIS — L2389 Allergic contact dermatitis due to other agents: Secondary | ICD-10-CM | POA: Diagnosis not present

## 2016-11-13 ENCOUNTER — Telehealth: Payer: Self-pay | Admitting: Pulmonary Disease

## 2016-11-13 NOTE — Telephone Encounter (Signed)
Called and spoke to pt. Pt states she was advised by BQ to make an appt with [place] 4 weeks after procedure, lobectomy. Appt made with TP on 11/21/16. Pt verbalized understanding and denied any further questions or concerns at this time.

## 2016-11-16 DIAGNOSIS — H5213 Myopia, bilateral: Secondary | ICD-10-CM | POA: Diagnosis not present

## 2016-11-16 DIAGNOSIS — H524 Presbyopia: Secondary | ICD-10-CM | POA: Diagnosis not present

## 2016-11-20 DIAGNOSIS — G894 Chronic pain syndrome: Secondary | ICD-10-CM | POA: Diagnosis not present

## 2016-11-20 DIAGNOSIS — M47816 Spondylosis without myelopathy or radiculopathy, lumbar region: Secondary | ICD-10-CM | POA: Diagnosis not present

## 2016-11-20 DIAGNOSIS — M961 Postlaminectomy syndrome, not elsewhere classified: Secondary | ICD-10-CM | POA: Diagnosis not present

## 2016-11-20 DIAGNOSIS — M47812 Spondylosis without myelopathy or radiculopathy, cervical region: Secondary | ICD-10-CM | POA: Diagnosis not present

## 2016-11-21 ENCOUNTER — Ambulatory Visit (INDEPENDENT_AMBULATORY_CARE_PROVIDER_SITE_OTHER): Payer: Medicare Other | Admitting: Adult Health

## 2016-11-21 ENCOUNTER — Ambulatory Visit (INDEPENDENT_AMBULATORY_CARE_PROVIDER_SITE_OTHER)
Admission: RE | Admit: 2016-11-21 | Discharge: 2016-11-21 | Disposition: A | Discharge status: Home / Self Care | Payer: Medicare Other | Source: Ambulatory Visit | Attending: Adult Health | Admitting: Adult Health

## 2016-11-21 ENCOUNTER — Encounter: Payer: Self-pay | Admitting: Adult Health

## 2016-11-21 DIAGNOSIS — C3412 Malignant neoplasm of upper lobe, left bronchus or lung: Secondary | ICD-10-CM | POA: Diagnosis not present

## 2016-11-21 DIAGNOSIS — J449 Chronic obstructive pulmonary disease, unspecified: Secondary | ICD-10-CM

## 2016-11-21 DIAGNOSIS — Z09 Encounter for follow-up examination after completed treatment for conditions other than malignant neoplasm: Secondary | ICD-10-CM | POA: Diagnosis not present

## 2016-11-21 MED ORDER — TIOTROPIUM BROMIDE-OLODATEROL 2.5-2.5 MCG/ACT IN AERS: 2.0000 | INHALATION_SPRAY | Freq: Every day | RESPIRATORY_TRACT | 5 refills | Status: DC

## 2016-11-21 NOTE — Addendum Note (Signed)
Addended by: Doroteo Glassman D on: 11/21/2016 03:53 PM   Modules accepted: Orders

## 2016-11-21 NOTE — Patient Instructions (Addendum)
Restart Stiolto 2 puffs daily  Chest xray today .  Great job not smoking .  Work off your vapes  Follow up for CT chest in Feb as planned .  Clean incision sites with soap and water, pat dry , cover with telfa non stick until completely healed  Follow up Dr. Lake Bells in 2-3 months and As needed   Please contact office for sooner follow up if symptoms do not improve or worsen or seek emergency care

## 2016-11-21 NOTE — Addendum Note (Signed)
Addended by: Doroteo Glassman D on: 11/21/2016 04:27 PM   Modules accepted: Orders

## 2016-11-21 NOTE — Assessment & Plan Note (Signed)
Moderate COPD s/p VATS  Would restart stiolto to see if this helps  Plan  Patient Instructions  Restart Stiolto 2 puffs daily  Chest xray today .  Great job not smoking .  Work off your vapes  Follow up for CT chest in Feb as planned .  Clean incision sites with soap and water, pat dry , cover with telfa non stick until completely healed  Follow up Dr. Lake Bells in 2-3 months and As needed   Please contact office for sooner follow up if symptoms do not improve or worsen or seek emergency care

## 2016-11-21 NOTE — Progress Notes (Signed)
$'@Patient'v$  ID: [name], female    DOB: Oct 03, 1956, 60 y.o.   MRN: [id number]  Chief Complaint  Patient presents with  . Follow-up    Hospital follow-up for lung cancer    Referring provider: Deland Pretty, MD  HPI: 60 year old female former smoker seen for pulmonary consult for abnormal chest x-ray September 2017. CT chest showed LUL lesion 2.7cm . PET scan showed  hypermetaolic act in Left apical nodule . She underwent left VATS with wedge resection of the left upper lobe on October 17, 2016 . Found to have Stage 1 adenocarcinoma  11/21/2016 Post hospital follow up  Patient returns for a post hospital follow-up. Patient was recently found to have a left upper lobe lung nodule measuring 2.4 cm. PET scan did show hypermetabolic activity. Patient was evaluated by thoracic surgery and underwent a left VATS with wedge resection of the left upper lobe on 10/17/2016. Pathology came back with adenocarcinoma with margins  free of tumor. Lymph nodes were negative.  Since surgery she is doing well, does get winded with walking .  Not taking Stiolto since surgery . No cough or hemoptysis  Did notice incision sites are not completely helaed. No increased redness or drainage.  Not smokign, using vapes.    Allergies  Allergen Reactions  . Carafate [Sucralfate] Hives and Other (See Comments)    CHEST PAIN    . Cefaclor Hives and Other (See Comments)    CHEST PAIN    . Depakote [Divalproex Sodium] Hives and Other (See Comments)    CHEST PAIN    . Nitrofurantoin Hives    CHEST PAIN   . Sulfamethoxazole Hives    CHEST PAIN    . Wellbutrin [Bupropion] Hives and Other (See Comments)    CHEST PAIN     Immunization History  Administered Date(s) Administered  . Influenza Split 07/04/2016  . Influenza Whole 10/22/2007  . Td 04/09/2007    Past Medical History:  Diagnosis Date  . ADD (attention deficit disorder)   . Carotid stenosis   . Carpal tunnel syndrome on both sides    . Cervical spondylosis without myelopathy   . COPD (chronic obstructive pulmonary disease) ([place])   . Costochondritis   . Depression   . Fibromyalgia   . GERD (gastroesophageal reflux disease)   . Heart murmur    dx 20-30 yrs ago.    . Lumbosacral spondylosis without myelopathy   . Lung cancer, left upper lobe ([place]) 09/11/2016   09/2016 PET CT > spiculated left upper lobe nodule, no mediastinal lymphadenopathy, but some CERVICAL lymphadenopathy of undetermined significance  . Migraine    has them 12-15 days a month  . Mitral valve prolapse   . MRSA (methicillin resistant staph aureus) culture positive   . MRSA (methicillin resistant Staphylococcus aureus)    was in her perirectal abscess  back in 2006  . Myalgia and myositis, unspecified   . Palpitations   . Paroxysmal supraventricular tachycardia ([place])   . Pneumonia    of last yr (2016)  . SVT (supraventricular tachycardia) ([place])    very brief runs  . Tobacco abuse     Tobacco History: History  Smoking Status  . Current Every Day Smoker  . Packs/day: 1.50  . Types: E-cigarettes  Smokeless Tobacco  . Never Used    Comment: ONLY USING VAPE    Ready to quit: Yes Counseling given: Yes   Outpatient Encounter Prescriptions as of 11/21/2016  Medication Sig  . Ascorbic Acid (VITAMIN  C) 1000 MG tablet Take 1,000 mg by mouth daily.   Marland Kitchen aspirin EC 81 MG tablet Take 81 mg by mouth daily.  . Calcium Carbonate-Vit D-Min (CALCIUM 1200 PO) Take 1,200 mg by mouth 2 (two) times daily.  . cetirizine (ZYRTEC) 10 MG tablet Take 10 mg by mouth daily as needed for allergies (alternates between zyrtec and claritin weekly).   Marland Kitchen dexlansoprazole (DEXILANT) 60 MG capsule Take 60 mg by mouth daily.  . diphenhydrAMINE (BENADRYL) 25 MG tablet Take 25 mg by mouth every 6 (six) hours as needed for allergies.   Marland Kitchen docusate sodium (COLACE) 100 MG capsule Take 100 mg by mouth 2 (two) times daily as needed for constipation.  Marland Kitchen estradiol (ESTRACE) 1 MG  tablet Take 1 mg by mouth daily.  Marland Kitchen FIORICET 50-300-40 MG CAPS 1 to 2 every 4-6 hrs as needed (Patient taking differently: Take 1-2 tablets by mouth every 4 (four) hours as needed (migraines). )  . Ginger, Zingiber officinalis, (GINGER ROOT) 550 MG CAPS Take 550 mg by mouth 2 (two) times daily as needed (nausea).   . loratadine (CLARITIN) 10 MG tablet Take 10 mg by mouth daily as needed for allergies (Alternates between zyrtec and claritin weekly).   . medroxyPROGESTERone (PROVERA) 5 MG tablet Take 5 mg by mouth daily.   . methocarbamol (ROBAXIN) 750 MG tablet Take 750 mg by mouth 4 (four) times daily.  . Multiple Vitamin (MULTIVITAMIN WITH MINERALS) TABS Take 1 tablet by mouth daily.  Marland Kitchen oxyCODONE-acetaminophen (PERCOCET) 7.5-325 MG per tablet Take 1 tablet by mouth every 4 (four) hours as needed for moderate pain.   . pregabalin (LYRICA) 150 MG capsule Take 150 mg by mouth 2 (two) times daily.  Marland Kitchen pyridoxine (B-6) 100 MG tablet Take 100 mg by mouth 2 (two) times daily.  . ranitidine (ZANTAC) 150 MG tablet Take 150 mg by mouth 2 (two) times daily as needed. If dexilant doesn't work  . Tiotropium Bromide-Olodaterol (STIOLTO RESPIMAT) 2.5-2.5 MCG/ACT AERS Inhale 2 puffs into the lungs daily.  Marland Kitchen triamcinolone ointment (KENALOG) 0.1 % Apply 1 application topically 3 times/day as needed-between meals & bedtime.  . TURMERIC PO Take 550 mg by mouth 3 (three) times daily.   Marland Kitchen venlafaxine (EFFEXOR) 75 MG tablet Take 75 mg by mouth 3 (three) times daily with meals.  Marland Kitchen zolpidem (AMBIEN) 10 MG tablet Take 10 mg by mouth at bedtime as needed for sleep.   No facility-administered encounter medications on file as of 11/21/2016.      Review of Systems  Constitutional:   No  weight loss, night sweats,  Fevers, chills,  +fatigue, or  lassitude.  HEENT:   No headaches,  Difficulty swallowing,  Tooth/dental problems, or  Sore throat,                No sneezing, itching, ear ache, nasal congestion, post nasal  drip,   CV:  No chest pain,  Orthopnea, PND, swelling in lower extremities, anasarca, dizziness, palpitations, syncope.   GI  No heartburn, indigestion, abdominal pain, nausea, vomiting, diarrhea, change in bowel habits, loss of appetite, bloody stools.   Resp: No excess mucus, no productive cough,  No non-productive cough,  No coughing up of blood.  No change in color of mucus.  No wheezing.  No chest wall deformity  Skin: no rash or lesions.  GU: no dysuria, change in color of urine, no urgency or frequency.  No flank pain, no hematuria   MS:  No joint pain  or swelling.  No decreased range of motion.  No back pain.    Physical Exam  BP 100/60   Pulse 70   Temp 98.1 F (36.7 C) (Oral)   Ht '5\' 7"'$  (1.702 m)   Wt 165 lb 6.4 oz (75 kg)   LMP 09/07/2006 (Within Years) Comment: >10 years  SpO2 98%   BMI 25.91 kg/m   GEN: A/Ox3; pleasant , NAD, well nourished    HEENT:  [place]/AT,  EACs-clear, TMs-wnl, NOSE-clear, THROAT-clear, no lesions, no postnasal drip or exudate noted.   NECK:  Supple w/ fair ROM; no JVD; normal carotid impulses w/o bruits; no thyromegaly or nodules palpated; no lymphadenopathy.    RESP  Clear  P & A; w/o, wheezes/ rales/ or rhonchi. no accessory muscle use, no dullness to percussion  CARD:  RRR, no m/r/g, no peripheral edema, pulses intact, no cyanosis or clubbing.  GI:   Soft & nt; nml bowel sounds; no organomegaly or masses detected.   Musco: Warm bil, no deformities or joint swelling noted.   Neuro: alert, no focal deficits noted.    Skin: Warm, no lesions or rashes  Psych:  No change in mood or affect. No depression or anxiety.  No memory loss.  Lab Results:  CBC    Component Value Date/Time   WBC 9.6 10/19/2016 0500   RBC 4.08 10/19/2016 0500   HGB 12.8 10/19/2016 0500   HGB 13.9 05/11/2009 1156   HCT 39.2 10/19/2016 0500   HCT 41.2 05/11/2009 1156   PLT 207 10/19/2016 0500   PLT 275 05/11/2009 1156   MCV 96.1 10/19/2016 0500   MCV 99  05/11/2009 1156   MCH 31.4 10/19/2016 0500   MCHC 32.7 10/19/2016 0500   RDW 13.0 10/19/2016 0500   RDW 10.9 05/11/2009 1156   LYMPHSABS 2.9 09/21/2016 0600   LYMPHSABS 3.1 05/11/2009 1156   MONOABS 0.4 09/21/2016 0600   EOSABS 0.1 09/21/2016 0600   EOSABS 0.2 05/11/2009 1156   BASOSABS 0.1 09/21/2016 0600   BASOSABS 0.0 05/11/2009 1156    BMET    Component Value Date/Time   NA 138 10/19/2016 0500   K 4.1 10/19/2016 0500   CL 104 10/19/2016 0500   CO2 29 10/19/2016 0500   GLUCOSE 117 (H) 10/19/2016 0500   BUN 5 (L) 10/19/2016 0500   CREATININE 0.64 10/19/2016 0500   CALCIUM 8.9 10/19/2016 0500   GFRNONAA >60 10/19/2016 0500   GFRAA >60 10/19/2016 0500    BNP No results found for: BNP  ProBNP No results found for: PROBNP  Imaging: Dg Chest 2 View  Result Date: 11/02/2016 CLINICAL DATA:  Lung cancer.  History of VATS. EXAM: CHEST  2 VIEW COMPARISON:  10/21/2016 . FINDINGS: Mediastinum and hilar structures are stable. Heart size stable. Postsurgical changes left lung. Previous identified left apical left upper lobe pneumothorax has resolved. Tiny focal air collection noted over the left upper lung may represent a small collection of parenchymal or pleural air following surgery. Follow-up exam suggested . Right lung is clear. Heart size normal. IMPRESSION: 1. Postsurgical changes left lung. Interim resolution of previously identified left apical pneumothorax. 2. There is a tiny collection of air noted over the left upper lobe, this may be postsurgical parenchymal or pleural air and continued follow-up chest x-rays suggested. Electronically Signed   By: Marcello Moores  Register   On: 11/02/2016 08:54     Assessment & Plan:   COPD mixed type ([place]) Moderate COPD s/p VATS  Would restart stiolto  to see if this helps  Plan  Patient Instructions  Restart Stiolto 2 puffs daily  Chest xray today .  Great job not smoking .  Work off your vapes  Follow up for CT chest in Feb as  planned .  Clean incision sites with soap and water, pat dry , cover with telfa non stick until completely healed  Follow up Dr. Lake Bells in 2-3 months and As needed   Please contact office for sooner follow up if symptoms do not improve or worsen or seek emergency care      Lung cancer, left upper lobe [facility]) Doing well s/p VATS LUL wedge resection.  Clean incision sites well , montior for sign of infection  follow up cxr today   Follow up for serial CT in Feb.       Tammy Parrett, NP 11/21/2016

## 2016-11-21 NOTE — Assessment & Plan Note (Signed)
Doing well s/p VATS LUL wedge resection.  Clean incision sites well , montior for sign of infection  follow up cxr today   Follow up for serial CT in Feb.

## 2016-11-22 NOTE — Progress Notes (Signed)
Reviewed, agree 

## 2016-11-24 NOTE — Progress Notes (Signed)
Spoke with pt and notified of results per TP. Pt verbalized understanding and denied any questions.

## 2016-12-18 DIAGNOSIS — M47812 Spondylosis without myelopathy or radiculopathy, cervical region: Secondary | ICD-10-CM | POA: Diagnosis not present

## 2016-12-18 DIAGNOSIS — G894 Chronic pain syndrome: Secondary | ICD-10-CM | POA: Diagnosis not present

## 2016-12-18 DIAGNOSIS — M961 Postlaminectomy syndrome, not elsewhere classified: Secondary | ICD-10-CM | POA: Diagnosis not present

## 2016-12-18 DIAGNOSIS — M47816 Spondylosis without myelopathy or radiculopathy, lumbar region: Secondary | ICD-10-CM | POA: Diagnosis not present

## 2016-12-28 ENCOUNTER — Encounter: Payer: Self-pay | Admitting: Pulmonary Disease

## 2016-12-28 ENCOUNTER — Ambulatory Visit (INDEPENDENT_AMBULATORY_CARE_PROVIDER_SITE_OTHER): Payer: Medicare Other | Admitting: Pulmonary Disease

## 2016-12-28 DIAGNOSIS — C3412 Malignant neoplasm of upper lobe, left bronchus or lung: Secondary | ICD-10-CM | POA: Diagnosis not present

## 2016-12-28 DIAGNOSIS — J449 Chronic obstructive pulmonary disease, unspecified: Secondary | ICD-10-CM

## 2016-12-28 MED ORDER — IPRATROPIUM-ALBUTEROL 18-103 MCG/ACT IN AERO: 2.0000 | INHALATION_SPRAY | Freq: Four times a day (QID) | RESPIRATORY_TRACT | 2 refills | Status: DC | PRN

## 2016-12-28 NOTE — Progress Notes (Signed)
Subjective:    Patient ID: [name], female    DOB: 10-May-1956, 61 y.o.   MRN: [id number]  HPI Chief Complaint  Patient presents with  . Follow-up    [id number]-    DSeriyahis doing well since the last visit.  She had three teeth extracted today so she is a little loopy in the office.  However she reports that her breathing has been doing well.    She notes some dyspnea: > it comes and goes > she can't predict when it is going to happen or bring it on > she says that deep breathing helps with this, she has been using the incentive spirometry > Stiolto hasn't been helping much, she uses it twice per day, she can't tell if it makes her feel less short of breath or not.    No prednisone or antibiotics since the last visit.  She has not been exercising.  She has been depressed somewhat since the hospitalization.    Past Medical History:  Diagnosis Date  . ADD (attention deficit disorder)   . Carotid stenosis   . Carpal tunnel syndrome on both sides   . Cervical spondylosis without myelopathy   . COPD (chronic obstructive pulmonary disease) ([place]   . Costochondritis   . Depression   . Fibromyalgia   . GERD (gastroesophageal reflux disease)   . Heart murmur    dx 20-30 yrs ago.    . Lumbosacral spondylosis without myelopathy   . Lung cancer, left upper lobe ([place] 09/11/2016   09/2016 PET CT > spiculated left upper lobe nodule, no mediastinal lymphadenopathy, but some CERVICAL lymphadenopathy of undetermined significance  . Migraine    has them 12-15 days a month  . Mitral valve prolapse   . MRSA (methicillin resistant staph aureus) culture positive   . MRSA (methicillin resistant Staphylococcus aureus)    was in her perirectal abscess  back in 2006  . Myalgia and myositis, unspecified   . Palpitations   . Paroxysmal supraventricular tachycardia ([facility]   . Pneumonia    of last yr (2016)  . SVT (supraventricular tachycardia) ([facility]    very brief runs  .  Tobacco abuse       Review of Systems  Constitutional: Negative for chills and fatigue.  HENT: Negative for postnasal drip, rhinorrhea and sinus pain.   Respiratory: Positive for shortness of breath. Negative for cough and wheezing.   Cardiovascular: Negative for chest pain, palpitations and leg swelling.       Objective:   Physical Exam  Vitals:   12/28/16 1550  BP: 118/80  Pulse: 81  SpO2: 99%  Weight: 172 lb (78 kg)  Height: '5\' 7"'$  (1.702 m)   RA  Gen: well appearing HENT: OP clear, TM's clear, neck supple PULM: CTA B, normal percussion CV: RRR, no mgr, trace edema GI: BS+, soft, nontender Derm: no cyanosis or rash Psyche: normal mood and affect   Records from Dr GServando Snarereviewed, surgical pathology showed adenocarcinoma      Assessment & Plan:  Lung cancer, left upper lobe ([facility] November 2017 left upper lobectomy showed evidence of stage I adenocarcinoma. She will continue follow-up with Dr. GServando Snare  COPD mixed type ([place] She has moderate airflow obstruction. She has received no benefit from long acting bronchodilators. We will prescribe when necessary albuterol and ipratropium. Immunizations up-to-date.    Current Outpatient Prescriptions:  .  Ascorbic Acid (VITAMIN C) 1000 MG tablet, Take 1,000 mg by mouth daily. ,  Disp: , Rfl:  .  aspirin EC 81 MG tablet, Take 81 mg by mouth daily., Disp: , Rfl:  .  Calcium Carbonate-Vit D-Min (CALCIUM 1200 PO), Take 1,200 mg by mouth 2 (two) times daily., Disp: , Rfl:  .  cetirizine (ZYRTEC) 10 MG tablet, Take 10 mg by mouth daily as needed for allergies (alternates between zyrtec and claritin weekly). , Disp: , Rfl:  .  dexlansoprazole (DEXILANT) 60 MG capsule, Take 60 mg by mouth daily., Disp: , Rfl:  .  diphenhydrAMINE (BENADRYL) 25 MG tablet, Take 25 mg by mouth every 6 (six) hours as needed for allergies. , Disp: , Rfl:  .  docusate sodium (COLACE) 100 MG capsule, Take 100 mg by mouth 2 (two) times daily as needed  for constipation., Disp: , Rfl:  .  estradiol (ESTRACE) 1 MG tablet, Take 1 mg by mouth daily., Disp: , Rfl:  .  FIORICET 50-300-40 MG CAPS, 1 to 2 every 4-6 hrs as needed (Patient taking differently: Take 1-2 tablets by mouth every 4 (four) hours as needed (migraines). ), Disp: 60 capsule, Rfl: 0 .  Ginger, Zingiber officinalis, (GINGER ROOT) 550 MG CAPS, Take 550 mg by mouth 2 (two) times daily as needed (nausea). , Disp: , Rfl:  .  loratadine (CLARITIN) 10 MG tablet, Take 10 mg by mouth daily as needed for allergies (Alternates between zyrtec and claritin weekly). , Disp: , Rfl:  .  medroxyPROGESTERone (PROVERA) 5 MG tablet, Take 5 mg by mouth daily. , Disp: , Rfl:  .  Multiple Vitamin (MULTIVITAMIN WITH MINERALS) TABS, Take 1 tablet by mouth daily., Disp: , Rfl:  .  oxyCODONE-acetaminophen (PERCOCET) 7.5-325 MG per tablet, Take 1 tablet by mouth every 4 (four) hours as needed for moderate pain. , Disp: , Rfl:  .  pregabalin (LYRICA) 150 MG capsule, Take 150 mg by mouth 2 (two) times daily., Disp: , Rfl:  .  pyridoxine (B-6) 100 MG tablet, Take 100 mg by mouth 2 (two) times daily., Disp: , Rfl:  .  ranitidine (ZANTAC) 150 MG tablet, Take 150 mg by mouth 2 (two) times daily as needed. If dexilant doesn't work, Disp: , Rfl:  .  Tiotropium Bromide-Olodaterol (STIOLTO RESPIMAT) 2.5-2.5 MCG/ACT AERS, Inhale 2 puffs into the lungs daily., Disp: 4 g, Rfl: 5 .  triamcinolone ointment (KENALOG) 0.1 %, Apply 1 application topically 3 times/day as needed-between meals & bedtime., Disp: , Rfl:  .  TURMERIC PO, Take 550 mg by mouth 3 (three) times daily. , Disp: , Rfl:  .  venlafaxine (EFFEXOR) 75 MG tablet, Take 75 mg by mouth 3 (three) times daily with meals., Disp: , Rfl:  .  zolpidem (AMBIEN) 10 MG tablet, Take 10 mg by mouth at bedtime as needed for sleep., Disp: , Rfl:

## 2016-12-28 NOTE — Assessment & Plan Note (Signed)
She has moderate airflow obstruction. She has received no benefit from long acting bronchodilators. We will prescribe when necessary albuterol and ipratropium. Immunizations up-to-date.

## 2016-12-28 NOTE — Patient Instructions (Signed)
Stop Stiolto Use Combivent as needed for shortness of breath, up to 4 times a day as needed We will see you back in one year or sooner if needed

## 2016-12-28 NOTE — Assessment & Plan Note (Signed)
November 2017 left upper lobectomy showed evidence of stage I adenocarcinoma. She will continue follow-up with Dr. Servando Snare.

## 2017-01-04 ENCOUNTER — Encounter: Payer: Self-pay | Admitting: Cardiothoracic Surgery

## 2017-01-10 ENCOUNTER — Encounter: Payer: Self-pay | Admitting: Cardiothoracic Surgery

## 2017-01-22 DIAGNOSIS — M47816 Spondylosis without myelopathy or radiculopathy, lumbar region: Secondary | ICD-10-CM | POA: Diagnosis not present

## 2017-01-22 DIAGNOSIS — M47812 Spondylosis without myelopathy or radiculopathy, cervical region: Secondary | ICD-10-CM | POA: Diagnosis not present

## 2017-01-22 DIAGNOSIS — M961 Postlaminectomy syndrome, not elsewhere classified: Secondary | ICD-10-CM | POA: Diagnosis not present

## 2017-01-22 DIAGNOSIS — G894 Chronic pain syndrome: Secondary | ICD-10-CM | POA: Diagnosis not present

## 2017-01-26 ENCOUNTER — Other Ambulatory Visit: Payer: Self-pay | Admitting: *Deleted

## 2017-01-26 DIAGNOSIS — Z902 Acquired absence of lung [part of]: Secondary | ICD-10-CM

## 2017-01-26 DIAGNOSIS — C3412 Malignant neoplasm of upper lobe, left bronchus or lung: Secondary | ICD-10-CM

## 2017-02-01 ENCOUNTER — Ambulatory Visit
Admission: RE | Admit: 2017-02-01 | Discharge: 2017-02-01 | Disposition: A | Discharge status: Home / Self Care | Payer: Medicare Other | Source: Ambulatory Visit | Attending: Cardiothoracic Surgery | Admitting: Cardiothoracic Surgery

## 2017-02-01 ENCOUNTER — Encounter: Payer: Self-pay | Admitting: Cardiothoracic Surgery

## 2017-02-01 ENCOUNTER — Ambulatory Visit (INDEPENDENT_AMBULATORY_CARE_PROVIDER_SITE_OTHER): Payer: Medicare Other | Admitting: Cardiothoracic Surgery

## 2017-02-01 VITALS — BP 123/69 | HR 78 | Resp 20 | Ht 67.0 in | Wt 172.0 lb

## 2017-02-01 DIAGNOSIS — Z902 Acquired absence of lung [part of]: Secondary | ICD-10-CM | POA: Diagnosis not present

## 2017-02-01 DIAGNOSIS — C3492 Malignant neoplasm of unspecified part of left bronchus or lung: Secondary | ICD-10-CM

## 2017-02-01 DIAGNOSIS — R0602 Shortness of breath: Secondary | ICD-10-CM | POA: Diagnosis not present

## 2017-02-01 DIAGNOSIS — C3412 Malignant neoplasm of upper lobe, left bronchus or lung: Secondary | ICD-10-CM

## 2017-02-01 NOTE — Progress Notes (Signed)
[address]       ,[place] [address]             [phone]      Mazell L Laboy [facility] Medical Record #[id number] Date of Birth: 04-08-1956  Referring: Juanito Doom, MD Primary Care: Horatio Pel, MD  Chief Complaint:   POST OP FOLLOW UP 10/17/2016  OPERATIVE REPORT PREOPERATIVE DIAGNOSIS:  Left upper lobe lung mass. POSTOPERATIVE DIAGNOSIS:  Adenocarcinoma of the left upper lobe. PROCEDURE PERFORMED:  Video bronchoscopy, left video-assisted thoracoscopy, mini thoracotomy, wedge resection of left upper lobe, completion of left upper lobectomy with lymph node dissection and placement of On-Q device, and lymph node dissection. SURGEON:  Lanelle Bal, M.D.  Cancer Staging Lung cancer, left upper lobe ([place]) Staging form: Lung, AJCC 7th Edition - Pathologic stage from 10/17/2016: Stage IA (T1b, N0, cM0) - Signed by [name], MD on 10/18/2016    History of Present Illness:     Patient returns to the office today after  left upper lobectomy for stage I a adenocarcinoma of the lung 2.4 cm in size.   Her respiratory status has been good. She remains smoke free, but is vaping. The patient is followed by pulmonary and has been on bronchodilators in the past currently not using. She does note some exertional shortness of breath. She will discuss restarting bronchodilators with her pulmonary physician.     Past Medical History:  Diagnosis Date  . ADD (attention deficit disorder)   . Carotid stenosis   . Carpal tunnel syndrome on both sides   . Cervical spondylosis without myelopathy   . COPD (chronic obstructive pulmonary disease) ([place])   . Costochondritis   . Depression   . Fibromyalgia   . GERD (gastroesophageal reflux disease)   . Heart murmur    dx 20-30 yrs ago.    . Lumbosacral spondylosis without myelopathy   . Lung cancer, left upper lobe ([place]) 09/11/2016   09/2016 PET CT > spiculated left upper lobe  nodule, no mediastinal lymphadenopathy, but some CERVICAL lymphadenopathy of undetermined significance  . Migraine    has them 12-15 days a month  . Mitral valve prolapse   . MRSA (methicillin resistant staph aureus) culture positive   . MRSA (methicillin resistant Staphylococcus aureus)    was in her perirectal abscess  back in 2006  . Myalgia and myositis, unspecified   . Palpitations   . Paroxysmal supraventricular tachycardia ([place])   . Pneumonia    of last yr (2016)  . SVT (supraventricular tachycardia) ([place])    very brief runs  . Tobacco abuse      History  Smoking Status  . Current Every Day Smoker  . Packs/day: 1.50  . Types: E-cigarettes  Smokeless Tobacco  . Never Used    Comment: ONLY USING VAPE     History  Alcohol Use No     Allergies  Allergen Reactions  . Carafate [Sucralfate] Hives and Other (See Comments)    CHEST PAIN    . Cefaclor Hives and Other (See Comments)    CHEST PAIN    . Depakote [Divalproex Sodium] Hives and Other (See Comments)    CHEST PAIN    . Nitrofurantoin Hives    CHEST PAIN   . Sulfamethoxazole Hives    CHEST PAIN    . Wellbutrin [Bupropion] Hives and Other (See Comments)    CHEST PAIN     Current Outpatient Prescriptions  Medication Sig Dispense  Refill  . Ascorbic Acid (VITAMIN C) 1000 MG tablet Take 1,000 mg by mouth daily.     Marland Kitchen aspirin EC 81 MG tablet Take 81 mg by mouth daily.    . Calcium Carbonate-Vit D-Min (CALCIUM 1200 PO) Take 1,200 mg by mouth 2 (two) times daily.    . cetirizine (ZYRTEC) 10 MG tablet Take 10 mg by mouth daily as needed for allergies (alternates between zyrtec and claritin weekly).     Marland Kitchen dexlansoprazole (DEXILANT) 60 MG capsule Take 60 mg by mouth daily.    . diphenhydrAMINE (BENADRYL) 25 MG tablet Take 25 mg by mouth every 6 (six) hours as needed for allergies.     Marland Kitchen docusate sodium (COLACE) 100 MG capsule Take 100 mg by mouth 2 (two) times daily as needed for constipation.    Marland Kitchen estradiol  (ESTRACE) 1 MG tablet Take 1 mg by mouth daily.    Marland Kitchen FIORICET 50-300-40 MG CAPS 1 to 2 every 4-6 hrs as needed (Patient taking differently: Take 1-2 tablets by mouth every 4 (four) hours as needed (migraines). ) 60 capsule 0  . Ginger, Zingiber officinalis, (GINGER ROOT) 550 MG CAPS Take 550 mg by mouth 2 (two) times daily as needed (nausea).     . loratadine (CLARITIN) 10 MG tablet Take 10 mg by mouth daily as needed for allergies (Alternates between zyrtec and claritin weekly).     . medroxyPROGESTERone (PROVERA) 5 MG tablet Take 5 mg by mouth daily.     . Multiple Vitamin (MULTIVITAMIN WITH MINERALS) TABS Take 1 tablet by mouth daily.    Marland Kitchen oxyCODONE-acetaminophen (PERCOCET) 7.5-325 MG per tablet Take 1 tablet by mouth every 4 (four) hours as needed for moderate pain.     . pregabalin (LYRICA) 150 MG capsule Take 150 mg by mouth 2 (two) times daily.    Marland Kitchen pyridoxine (B-6) 100 MG tablet Take 100 mg by mouth 2 (two) times daily.    . ranitidine (ZANTAC) 150 MG tablet Take 150 mg by mouth 2 (two) times daily as needed. If dexilant doesn't work    . triamcinolone ointment (KENALOG) 0.1 % Apply 1 application topically 3 times/day as needed-between meals & bedtime.    . TURMERIC PO Take 550 mg by mouth 3 (three) times daily.     Marland Kitchen venlafaxine (EFFEXOR) 75 MG tablet Take 75 mg by mouth 2 (two) times daily with a meal.      No current facility-administered medications for this visit.        Physical Exam: BP 123/69   Pulse 78   Resp 20   Ht '5\' 7"'$  (1.702 m)   Wt 172 lb (78 kg)   LMP 09/07/2006 (Within Years) Comment: >10 years  SpO2 93% Comment: RA  BMI 26.94 kg/m   General appearance: alert and cooperative Neurologic: intact Heart: regular rate and rhythm, S1, S2 normal, no murmur, click, rub or gallop Lungs: clear to auscultation bilaterally Abdomen: soft, non-tender; bowel sounds normal; no masses,  no organomegaly Extremities: extremities normal, atraumatic, no cyanosis or edema and  Homans sign is negative, no sign of DVT Wound: Wound is well-healed. There is no evidence of infection   Diagnostic Studies & Laboratory data:     Recent Radiology Findings:   Dg Chest 2 View  Result Date: 02/01/2017 CLINICAL DATA:  Post left upper lobectomy for resection of adenocarcinoma on 10/17/2016, now with some shortness of breath EXAM: CHEST  2 VIEW COMPARISON:  Chest x-ray of 11/21/2016 FINDINGS: Haziness in the  medial left upper hemithorax is secondary to prior left upper lobectomy. No active infiltrate or effusion is seen. Mediastinal and hilar contours are unremarkable. The heart is within normal limits in size. No bony abnormality is seen. IMPRESSION: Stable postop change in the left upper hemithorax. No active lung disease. Electronically Signed   By: Ivar Drape M.D.   On: 02/01/2017 10:32      Recent Lab Findings: Lab Results  Component Value Date   WBC 9.6 10/19/2016   HGB 12.8 10/19/2016   HCT 39.2 10/19/2016   PLT 207 10/19/2016   GLUCOSE 117 (H) 10/19/2016   CHOL 182 04/09/2007   TRIG 68 04/09/2007   HDL 51 04/09/2007   LDLCALC 117 04/09/2007   ALT 15 10/19/2016   AST 24 10/19/2016   NA 138 10/19/2016   K 4.1 10/19/2016   CL 104 10/19/2016   CREATININE 0.64 10/19/2016   BUN 5 (L) 10/19/2016   CO2 29 10/19/2016   INR 1.05 10/16/2016      Assessment / Plan:     Patient doing well following recent left upper lobectomy for stage I a adenocarcinoma of the lung The patient is very proud of the fact that she's remained smoke free. Her case was presented at the multidisciplinary thoracic oncology conference and the consensus was that she did not need any further treatment other than observation. In May 2018 we will obtain a CT scan of the chest which will be approximate 6 months from her original surgery  [name] MD      [address] [place] [address] Office [phone]   [place] [phone]  02/01/2017 11:00 AM

## 2017-02-02 ENCOUNTER — Telehealth: Payer: Self-pay | Admitting: Pulmonary Disease

## 2017-02-02 ENCOUNTER — Ambulatory Visit (INDEPENDENT_AMBULATORY_CARE_PROVIDER_SITE_OTHER): Payer: Medicare Other | Admitting: Internal Medicine

## 2017-02-02 ENCOUNTER — Encounter: Payer: Self-pay | Admitting: Internal Medicine

## 2017-02-02 VITALS — BP 102/64 | HR 80 | Temp 98.4°F | Ht 67.0 in | Wt 171.8 lb

## 2017-02-02 DIAGNOSIS — J449 Chronic obstructive pulmonary disease, unspecified: Secondary | ICD-10-CM

## 2017-02-02 NOTE — Telephone Encounter (Signed)
Spoke with pt. States that she is having increased SOB and her inhalers are not working. I have scheduled the pt to see MW today at 3:45pm. Nothing further was needed.

## 2017-02-02 NOTE — Progress Notes (Signed)
Subjective:    Patient ID: [name], female    DOB: 20-Feb-1956 .   MRN: [id number]      12/28/16 [place]  doing well since the last visit.  She had three teeth extracted today so she is a little loopy in the office.  However she reports that her breathing has been doing well.    She notes some dyspnea: > it comes and goes > she can't predict when it is going to happen or bring it on > she says that deep breathing helps with this, she has been using the incentive spirometry > Stiolto hasn't been helping much, she uses it twice per day, she can't tell if it makes her feel less short of breath or not. rec Stop Stiolto Use Combivent as needed for shortness of breath, up to 4 times a day as needed     02/02/2017  f/u ov/Wert re:   Copd GOLD II quit smoking 09/2016  Chief Complaint  Patient presents with  . Acute Visit    Pt c/o increased SOB since her last ov here end of Jan 2018. She states she is SOB with or without any exertion with no specific trigger. She has had a non prod cough for the past 2 days.   p surgery reported was still sob walking that did not improve with stiolto or combivent and now cc sob at rest that does not worsen with ex and comes on spordically lasting from 2-3 breaths to 2-3 hours and resolves spont/ never wakes her up/ no assoc cough or cp, some sensation of globus/ hoarseness with voice fatigue - speaking seems to bring it on /   No obvious day to day or daytime variability or assoc excess/ purulent sputum or mucus plugs or hemoptysis or cp or chest tightness, subjective wheeze or overt sinus or hb symptoms. No unusual exp hx or h/o childhood pna/ asthma or knowledge of premature birth.  Sleeping ok without nocturnal  or early am exacerbation  of respiratory  c/o's or need for noct saba. Also denies any obvious fluctuation of symptoms with weather or environmental changes or other aggravating or alleviating factors except as outlined above    Current Medications, Allergies, Complete Past Medical History, Past Surgical History, Family History, and Social History were reviewed in [employer] record.  ROS  The following are not active complaints unless bolded sore throat, dysphagia, dental problems, itching, sneezing,  nasal congestion or excess/ purulent secretions, ear ache,   fever, chills, sweats, unintended wt loss, classically pleuritic or exertional cp,  orthopnea pnd or leg swelling, presyncope, palpitations, abdominal pain, anorexia, nausea, vomiting, diarrhea  or change in bowel or bladder habits, change in stools or urine, dysuria,hematuria,  rash, arthralgias, visual complaints, headache, numbness, weakness or ataxia or problems with walking or coordination,  change in mood/affect or memory.                  Objective:   Physical Exam   amb wf Patient failed to answer a single question asked in a straightforward manner, tending to go off on tangents or answer questions with ambiguous medical terms or diagnoses and seemed quite perplexed / almost bizarre when asked the same question more than once for clarification pacing about the exam room in a hyperkinetic mode  Wt Readings from Last 3 Encounters:  02/02/17 171 lb 12.8 oz (77.9 kg)  02/01/17 172 lb (78 kg)  12/28/16 172 lb (78 kg)  Vital signs reviewed  - Note on arrival 02 sats  97% on RA     HEENT: nl dentition, turbinates bilaterally, and oropharynx. Nl external ear canals without cough reflex   NECK :  without JVD/Nodes/TM/ nl carotid upstrokes bilaterally   LUNGS: no acc muscle use,  Nl contour chest which is clear to A and P bilaterally without cough on insp or exp maneuvers   CV:  RRR  no s3 or murmur or increase in P2, and no edema   ABD:  soft and nontender with nl inspiratory excursion in the supine position. No bruits or organomegaly appreciated, bowel sounds nl  MS:  Nl gait/ ext warm without deformities, calf  tenderness, cyanosis or clubbing No obvious joint restrictions   SKIN: warm and dry without lesions    NEURO:  alert, anxious, nl sensorium with  no motor or cerebellar deficits apparent.      I personally reviewed images and agree with radiology impression as follows:  CXR:   02/01/17 Stable postop change in the left upper hemithorax. No active lung disease.     Assessment & Plan:

## 2017-02-02 NOTE — Patient Instructions (Addendum)
Only use your combivent  as a rescue medication to be used if you can't catch your breath by resting or doing a relaxed purse lip breathing pattern.  - The less you use it, the better it will work when you need it. - Ok to use up to 21puffs  every 4 hours if you must but call for immediate appointment if use goes up over your usual need - Don't leave home without it !!  (think of it like the spare tire for your car)    .  To get the most out of exercise, you need to be continuously aware that you are short of breath, but never out of breath, for 30 minutes daily. As you improve, it will actually be easier for you to do the same amount of exercise  in  30 minutes so always push to the level where you are short of breath.     Pulmonary follow up is as needed pending f/u with Dr Lake Bells yearly as he recommended at last visit

## 2017-02-03 NOTE — Assessment & Plan Note (Signed)
September 2017 simple spirometry ratio 65%, FEV1 2.01 L 65% predicted, FVC 3.08 L 82% predicted - Spirometry 02/02/2017  FEV1 1.70  (59%)  Ratio 60 with classic contour on f/v but unable to blow out for 6 sec so ratio likely overestimated  (on no rx pre-test)  - 02/02/2017  Walked RA x 3 laps @ 185 ft each stopped due to  End of study, moderately fast  pace, no sob or desat    Symptoms are markedly disproportionate to objective findings and not clear this is a lung problem but pt does appear to have difficult airway management issues. DDX of  difficult airways management almost all start with A and  include Adherence, Ace Inhibitors, Acid Reflux, Active Sinus Disease, Alpha 1 Antitripsin deficiency, Anxiety masquerading as Airways dz,  ABPA,  Allergy(esp in young), Aspiration (esp in elderly), Adverse effects of meds,  Active smokers, A bunch of PE's (a small clot burden can't cause this syndrome unless there is already severe underlying pulm or vascular dz with poor reserve) plus two Bs  = Bronchiectasis and Beta blocker use..and one C= CHF   Adherence is always the initial "prime suspect" and is a multilayered concern that requires a "trust but verify" approach in every patient - starting with knowing how to use medications, especially inhalers, correctly, keeping up with refills and understanding the fundamental difference between maintenance and prns vs those medications only taken for a very short course and then stopped and not refilled.  - if not better rec return with all meds in hand using a trust but verify approach to confirm accurate Medication  Reconciliation The principal here is that until we are certain that the  patients are doing what we've asked, it makes no sense to ask them to do more.   ? Acid (or non-acid) GERD > always difficult to exclude as up to 75% of pts in some series report no assoc GI/ Heartburn symptoms> rec continue max (24h)  acid suppression and diet restrictions/ reviewed      ? Allergy/ asthma > no cough /no noct symptoms or response to combivent so seems unlikely   ? A bunch of PE's seems very unlikely to explain her sporadic resting spells that can't be reproduced with ex  ? Anxiety > usually at the bottom of this list of usual suspects but should be much higher on this pt's based on H and P and note already on psychotropics . Follow up per Primary Care planned  Needs TSH checked at some point  ? chf > again would not explain this patter of sob    At this point spent extra time with pt explaining my reasoning and rec for regular paced ex and consider cpst if not improving with Follow up per Primary Care planned    Since no better on stiolto rec just use the combivent prn  02/02/2017  After extensive coaching [facility]  effectiveness =    90% > continue respimat device  I had an extended discussion with the patient reviewing all relevant studies completed to date and  lasting 25 minutes of a 40  minute acute  visit re severe  non-specific but potentially very serious refractory respiratory symptoms of unknown etiology.  Each maintenance medication was reviewed in detail including most importantly the difference between maintenance and prns and under what circumstances the prns are to be triggered using an action plan format that is not reflected in the computer generated alphabetically organized AVS.  Please see AVS for specific instructions unique to this office visit that I personally wrote and verbalized to the the pt in detail and then reviewed with pt  by my nurse highlighting any changes in therapy/plan of care  recommended at today's visit.

## 2017-02-05 ENCOUNTER — Other Ambulatory Visit: Payer: Self-pay | Admitting: Obstetrics and Gynecology

## 2017-02-05 DIAGNOSIS — Z124 Encounter for screening for malignant neoplasm of cervix: Secondary | ICD-10-CM | POA: Diagnosis not present

## 2017-02-05 DIAGNOSIS — N762 Acute vulvitis: Secondary | ICD-10-CM | POA: Diagnosis not present

## 2017-02-05 DIAGNOSIS — Z01419 Encounter for gynecological examination (general) (routine) without abnormal findings: Secondary | ICD-10-CM | POA: Diagnosis not present

## 2017-02-06 LAB — CYTOLOGY - PAP

## 2017-02-19 DIAGNOSIS — G894 Chronic pain syndrome: Secondary | ICD-10-CM | POA: Diagnosis not present

## 2017-02-19 DIAGNOSIS — M47816 Spondylosis without myelopathy or radiculopathy, lumbar region: Secondary | ICD-10-CM | POA: Diagnosis not present

## 2017-02-19 DIAGNOSIS — M961 Postlaminectomy syndrome, not elsewhere classified: Secondary | ICD-10-CM | POA: Diagnosis not present

## 2017-02-19 DIAGNOSIS — M47812 Spondylosis without myelopathy or radiculopathy, cervical region: Secondary | ICD-10-CM | POA: Diagnosis not present

## 2017-02-20 ENCOUNTER — Ambulatory Visit (INDEPENDENT_AMBULATORY_CARE_PROVIDER_SITE_OTHER): Payer: 59 | Admitting: Psychiatry

## 2017-02-20 DIAGNOSIS — F0632 Mood disorder due to known physiological condition with major depressive-like episode: Secondary | ICD-10-CM | POA: Diagnosis not present

## 2017-02-20 DIAGNOSIS — F909 Attention-deficit hyperactivity disorder, unspecified type: Secondary | ICD-10-CM

## 2017-03-01 DIAGNOSIS — M791 Myalgia: Secondary | ICD-10-CM | POA: Diagnosis not present

## 2017-03-15 ENCOUNTER — Ambulatory Visit (INDEPENDENT_AMBULATORY_CARE_PROVIDER_SITE_OTHER): Payer: 59 | Admitting: Psychiatry

## 2017-03-15 ENCOUNTER — Other Ambulatory Visit: Payer: Self-pay | Admitting: Cardiothoracic Surgery

## 2017-03-15 DIAGNOSIS — C3490 Malignant neoplasm of unspecified part of unspecified bronchus or lung: Secondary | ICD-10-CM

## 2017-03-15 DIAGNOSIS — F0632 Mood disorder due to known physiological condition with major depressive-like episode: Secondary | ICD-10-CM

## 2017-03-19 DIAGNOSIS — M961 Postlaminectomy syndrome, not elsewhere classified: Secondary | ICD-10-CM | POA: Diagnosis not present

## 2017-03-19 DIAGNOSIS — G894 Chronic pain syndrome: Secondary | ICD-10-CM | POA: Diagnosis not present

## 2017-03-19 DIAGNOSIS — M47816 Spondylosis without myelopathy or radiculopathy, lumbar region: Secondary | ICD-10-CM | POA: Diagnosis not present

## 2017-03-19 DIAGNOSIS — M47812 Spondylosis without myelopathy or radiculopathy, cervical region: Secondary | ICD-10-CM | POA: Diagnosis not present

## 2017-04-16 DIAGNOSIS — M47812 Spondylosis without myelopathy or radiculopathy, cervical region: Secondary | ICD-10-CM | POA: Diagnosis not present

## 2017-04-16 DIAGNOSIS — G894 Chronic pain syndrome: Secondary | ICD-10-CM | POA: Diagnosis not present

## 2017-04-16 DIAGNOSIS — M47816 Spondylosis without myelopathy or radiculopathy, lumbar region: Secondary | ICD-10-CM | POA: Diagnosis not present

## 2017-04-16 DIAGNOSIS — M961 Postlaminectomy syndrome, not elsewhere classified: Secondary | ICD-10-CM | POA: Diagnosis not present

## 2017-04-24 ENCOUNTER — Ambulatory Visit (INDEPENDENT_AMBULATORY_CARE_PROVIDER_SITE_OTHER): Payer: 59 | Admitting: Psychiatry

## 2017-04-24 DIAGNOSIS — F900 Attention-deficit hyperactivity disorder, predominantly inattentive type: Secondary | ICD-10-CM | POA: Diagnosis not present

## 2017-04-24 DIAGNOSIS — F0632 Mood disorder due to known physiological condition with major depressive-like episode: Secondary | ICD-10-CM | POA: Diagnosis not present

## 2017-04-25 DIAGNOSIS — S1096XA Insect bite of unspecified part of neck, initial encounter: Secondary | ICD-10-CM | POA: Diagnosis not present

## 2017-04-25 DIAGNOSIS — W57XXXA Bitten or stung by nonvenomous insect and other nonvenomous arthropods, initial encounter: Secondary | ICD-10-CM | POA: Diagnosis not present

## 2017-04-26 ENCOUNTER — Encounter: Payer: Self-pay | Admitting: Cardiothoracic Surgery

## 2017-04-26 ENCOUNTER — Ambulatory Visit (INDEPENDENT_AMBULATORY_CARE_PROVIDER_SITE_OTHER): Payer: Medicare Other | Admitting: Cardiothoracic Surgery

## 2017-04-26 ENCOUNTER — Ambulatory Visit
Admission: RE | Admit: 2017-04-26 | Discharge: 2017-04-26 | Disposition: A | Discharge status: Home / Self Care | Payer: Medicare Other | Source: Ambulatory Visit | Attending: Cardiothoracic Surgery | Admitting: Cardiothoracic Surgery

## 2017-04-26 VITALS — BP 107/71 | HR 80 | Resp 20 | Ht 67.0 in | Wt 173.0 lb

## 2017-04-26 DIAGNOSIS — Z902 Acquired absence of lung [part of]: Secondary | ICD-10-CM | POA: Diagnosis not present

## 2017-04-26 DIAGNOSIS — C3490 Malignant neoplasm of unspecified part of unspecified bronchus or lung: Secondary | ICD-10-CM

## 2017-04-26 DIAGNOSIS — C3492 Malignant neoplasm of unspecified part of left bronchus or lung: Secondary | ICD-10-CM | POA: Diagnosis not present

## 2017-04-26 DIAGNOSIS — J449 Chronic obstructive pulmonary disease, unspecified: Secondary | ICD-10-CM | POA: Diagnosis not present

## 2017-04-26 NOTE — Progress Notes (Signed)
[address]       [place] [address]             [phone]      [name] [facility] Medical Record #[id number] Date of Birth: [birth year]  Referring: Juanito Doom, MD Primary Care: Deland Pretty, MD  Chief Complaint:   POST OP FOLLOW UP 10/17/2016 OPERATIVE REPORT PREOPERATIVE DIAGNOSIS: Left upper lobe lung mass. POSTOPERATIVE DIAGNOSIS: Adenocarcinoma of the left upper lobe. PROCEDURE PERFORMED: Video bronchoscopy, left video-assisted thoracoscopy, mini thoracotomy, wedge resection of left upper lobe, completion of left upper lobectomy with lymph node dissection and placement of On-Q device, and lymph node dissection. SURGEON: Lanelle Bal, M.D.  Cancer Staging Lung cancer, left upper lobe ([place]) Staging form: Lung, AJCC 7th Edition - Pathologic stage from 10/17/2016: Stage IA (T1b, N0, cM0) - Signed by [name], MD on 10/18/2016    History of Present Illness:     Patient underwent left upper lobectomy for stage IA adenocarcinoma the lung in November 2017, she returns now with six-month follow-up and CT of the chest. From a surgical standpoint she's been making good progress without significant shortness of breath or limitations in function. She still has some paresthesias over the left chest     Past Medical History:  Diagnosis Date  . ADD (attention deficit disorder)   . Carotid stenosis   . Carpal tunnel syndrome on both sides   . Cervical spondylosis without myelopathy   . COPD (chronic obstructive pulmonary disease) ([place])   . Costochondritis   . Depression   . Fibromyalgia   . GERD (gastroesophageal reflux disease)   . Heart murmur    dx 20-30 yrs ago.    . Lumbosacral spondylosis without myelopathy   . Lung cancer, left upper lobe ([place]) 09/11/2016   09/2016 PET CT > spiculated left upper lobe nodule, no mediastinal lymphadenopathy, but some CERVICAL lymphadenopathy of undetermined significance  .  Migraine    has them 12-15 days a month  . Mitral valve prolapse   . MRSA (methicillin resistant staph aureus) culture positive   . MRSA (methicillin resistant Staphylococcus aureus)    was in her perirectal abscess  back in 2006  . Myalgia and myositis, unspecified   . Palpitations   . Paroxysmal supraventricular tachycardia ([place])   . Pneumonia    of last yr (2016)  . SVT (supraventricular tachycardia) ([place])    very brief runs  . Tobacco abuse      History  Smoking Status  . Former Smoker  . Packs/day: 1.50  . Years: 30.00  . Types: E-cigarettes  . Quit date: 09/03/2016  Smokeless Tobacco  . Never Used    Comment: ONLY USING VAPE     History  Alcohol Use No     Allergies  Allergen Reactions  . Carafate [Sucralfate] Hives and Other (See Comments)    CHEST PAIN    . Cefaclor Hives and Other (See Comments)    CHEST PAIN    . Depakote [Divalproex Sodium] Hives and Other (See Comments)    CHEST PAIN    . Nitrofurantoin Hives    CHEST PAIN   . Sulfamethoxazole Hives    CHEST PAIN    . Wellbutrin [Bupropion] Hives and Other (See Comments)    CHEST PAIN     Current Outpatient Prescriptions  Medication Sig Dispense Refill  . Ascorbic Acid (VITAMIN C) 1000 MG tablet Take 1,000 mg by mouth daily.     Marland Kitchen  aspirin EC 81 MG tablet Take 81 mg by mouth daily.    . Calcium Carbonate-Vit D-Min (CALCIUM 1200 PO) Take 1,200 mg by mouth 2 (two) times daily.    . cetirizine (ZYRTEC) 10 MG tablet Take 10 mg by mouth daily as needed for allergies (alternates between zyrtec and claritin weekly).     Marland Kitchen dexlansoprazole (DEXILANT) 60 MG capsule Take 60 mg by mouth daily.    . diphenhydrAMINE (BENADRYL) 25 MG tablet Take 25 mg by mouth every 6 (six) hours as needed for allergies.     Marland Kitchen docusate sodium (COLACE) 100 MG capsule Take 100 mg by mouth 2 (two) times daily as needed for constipation.    Marland Kitchen estradiol (ESTRACE) 1 MG tablet Take 1 mg by mouth daily.    Marland Kitchen FIORICET 50-300-40 MG  CAPS 1 to 2 every 4-6 hrs as needed (Patient taking differently: Take 1-2 tablets by mouth every 4 (four) hours as needed (migraines). ) 60 capsule 0  . Ginger, Zingiber officinalis, (GINGER ROOT) 550 MG CAPS Take 550 mg by mouth 2 (two) times daily as needed (nausea).     . loratadine (CLARITIN) 10 MG tablet Take 10 mg by mouth daily as needed for allergies (Alternates between zyrtec and claritin weekly).     . medroxyPROGESTERone (PROVERA) 5 MG tablet Take 5 mg by mouth daily.     . methocarbamol (ROBAXIN) 750 MG tablet Take 750 mg by mouth every 6 (six) hours as needed for muscle spasms.    . Multiple Vitamin (MULTIVITAMIN WITH MINERALS) TABS Take 1 tablet by mouth daily.    Marland Kitchen oxyCODONE-acetaminophen (PERCOCET) 7.5-325 MG per tablet Take 1 tablet by mouth every 4 (four) hours as needed for moderate pain.     . pregabalin (LYRICA) 150 MG capsule Take 150 mg by mouth 2 (two) times daily.    Marland Kitchen pyridoxine (B-6) 100 MG tablet Take 100 mg by mouth 2 (two) times daily.    . ranitidine (ZANTAC) 150 MG tablet Take 150 mg by mouth 2 (two) times daily as needed. If dexilant doesn't work    . traZODone (DESYREL) 100 MG tablet Take 200 mg by mouth at bedtime.    . triamcinolone ointment (KENALOG) 0.1 % Apply 1 application topically 3 times/day as needed-between meals & bedtime.    . TURMERIC PO Take 550 mg by mouth 3 (three) times daily.     Marland Kitchen venlafaxine (EFFEXOR) 75 MG tablet Take 150 mg by mouth 2 (two) times daily with a meal.      No current facility-administered medications for this visit.        Physical Exam: BP 107/71   Pulse 80   Resp 20   Ht 5\' 7"  (1.702 m)   Wt 173 lb (78.5 kg)   LMP 09/07/2006 (Within Years) Comment: >10 years  SpO2 98% Comment: RA  BMI 27.10 kg/m   General appearance: alert and cooperative Neurologic: intact Heart: regular rate and rhythm, S1, S2 normal, no murmur, click, rub or gallop Lungs: clear to auscultation bilaterally and normal percussion  bilaterally Abdomen: soft, non-tender; bowel sounds normal; no masses,  no organomegaly Extremities: extremities normal, atraumatic, no cyanosis or edema and Homans sign is negative, no sign of DVT Wound: Left chest port sites incisions are well-healed patient has no cervical supraclavicular adenopathy   Diagnostic Studies & Laboratory data:     Recent Radiology Findings:   Ct Chest Wo Contrast  Result Date: 04/26/2017 CLINICAL DATA:  LEFT lung cancer post wedge  resection, former smoker, LEFT breast and chest pain, COPD, GERD EXAM: CT CHEST WITHOUT CONTRAST TECHNIQUE: Multidetector CT imaging of the chest was performed following the standard protocol without IV contrast. Sagittal and coronal MPR images reconstructed from axial data set. COMPARISON:  PET-CT 09/08/2016 FINDINGS: Cardiovascular: Mild atherosclerotic calcifications aorta, proximal great vessels and coronary arteries. Minimal pericardial thickening or fluid. Heart normal size. Mediastinum/Nodes: Esophagus unremarkable. Base of cervical region normal appearance. Few scattered normal sized mediastinal lymph nodes without thoracic adenopathy. Lungs/Pleura: Postsurgical change/resection at LEFT upper lobe. Minimal subsegmental atelectasis at RIGHT middle lobe. Lungs otherwise clear. No pulmonary infiltrate, pleural effusion, pneumothorax or pulmonary mass. Upper Abdomen: Normal appearance Musculoskeletal: Question old healed fracture of the lateral LEFT fourth rib. No acute osseous findings. IMPRESSION: Interval resection of previously identified LEFT upper lobe tumor. No acute intrathoracic abnormalities. Aortic atherosclerosis and coronary artery calcification. Electronically Signed   By: Lavonia Dana M.D.   On: 04/26/2017 11:47      Recent Lab Findings: Lab Results  Component Value Date   WBC 9.6 10/19/2016   HGB 12.8 10/19/2016   HCT 39.2 10/19/2016   PLT 207 10/19/2016   GLUCOSE 117 (H) 10/19/2016   CHOL 182 04/09/2007   TRIG 68  04/09/2007   HDL 51 04/09/2007   LDLCALC 117 04/09/2007   ALT 15 10/19/2016   AST 24 10/19/2016   NA 138 10/19/2016   K 4.1 10/19/2016   CL 104 10/19/2016   CREATININE 0.64 10/19/2016   BUN 5 (L) 10/19/2016   CO2 29 10/19/2016   INR 1.05 10/16/2016      Assessment / Plan:      Patient doing well now approximate 6 months after resection of stage I adenocarcinoma the left upper lobe, no evidence of recurrence on follow-up CT scan- I plan to see her back in 6 months with a follow-up CT She continues to be smoke free, and is no longer using vape cigarettes      [name] MD      [address] [place] [address] Office [phone]   Beeper [phone]  04/26/2017 1:09 PM

## 2017-05-15 DIAGNOSIS — G894 Chronic pain syndrome: Secondary | ICD-10-CM | POA: Diagnosis not present

## 2017-05-15 DIAGNOSIS — M961 Postlaminectomy syndrome, not elsewhere classified: Secondary | ICD-10-CM | POA: Diagnosis not present

## 2017-05-15 DIAGNOSIS — M47816 Spondylosis without myelopathy or radiculopathy, lumbar region: Secondary | ICD-10-CM | POA: Diagnosis not present

## 2017-05-15 DIAGNOSIS — M47812 Spondylosis without myelopathy or radiculopathy, cervical region: Secondary | ICD-10-CM | POA: Diagnosis not present

## 2017-05-16 ENCOUNTER — Ambulatory Visit (INDEPENDENT_AMBULATORY_CARE_PROVIDER_SITE_OTHER): Payer: 59 | Admitting: Psychiatry

## 2017-05-16 DIAGNOSIS — F0632 Mood disorder due to known physiological condition with major depressive-like episode: Secondary | ICD-10-CM

## 2017-05-16 DIAGNOSIS — F900 Attention-deficit hyperactivity disorder, predominantly inattentive type: Secondary | ICD-10-CM

## 2017-07-10 DIAGNOSIS — M961 Postlaminectomy syndrome, not elsewhere classified: Secondary | ICD-10-CM | POA: Diagnosis not present

## 2017-07-10 DIAGNOSIS — M47812 Spondylosis without myelopathy or radiculopathy, cervical region: Secondary | ICD-10-CM | POA: Diagnosis not present

## 2017-07-10 DIAGNOSIS — M47816 Spondylosis without myelopathy or radiculopathy, lumbar region: Secondary | ICD-10-CM | POA: Diagnosis not present

## 2017-07-10 DIAGNOSIS — G894 Chronic pain syndrome: Secondary | ICD-10-CM | POA: Diagnosis not present

## 2017-07-16 ENCOUNTER — Telehealth: Payer: Self-pay | Admitting: Pulmonary Disease

## 2017-07-16 NOTE — Telephone Encounter (Signed)
Pt c/o dry cough x 2 nights.  Pt states that the cough is keeping her awake at night.  Most recent CT 04/2017 was normal/okay. Previous diagnosis Lung cancer left upper lobe -- LUL removed, stage one, 9 lymph nodes removed.  Pt not using any OTC meds for cough given her heart condition -- causes heart flutter and elevated heart rate. Denies SOB, wheezing or chest tightness.   Please advise Judson Roch as Dr Lake Bells is not available. Thanks.

## 2017-07-16 NOTE — Telephone Encounter (Signed)
Called and spoke to pt. Informed her of the recs per SG. Appt made with TP on 07/17/17. Pt verbalized understanding and denied any further questions or concerns at this time.

## 2017-07-16 NOTE — Telephone Encounter (Signed)
She needs to be seen. Thanks 

## 2017-07-17 ENCOUNTER — Encounter: Payer: Self-pay | Admitting: Adult Health

## 2017-07-17 ENCOUNTER — Ambulatory Visit (INDEPENDENT_AMBULATORY_CARE_PROVIDER_SITE_OTHER): Payer: Medicare Other | Admitting: Adult Health

## 2017-07-17 ENCOUNTER — Ambulatory Visit (HOSPITAL_COMMUNITY): Payer: 59 | Admitting: Psychiatry

## 2017-07-17 ENCOUNTER — Ambulatory Visit (INDEPENDENT_AMBULATORY_CARE_PROVIDER_SITE_OTHER): Payer: Medicare Other | Admitting: Psychiatry

## 2017-07-17 ENCOUNTER — Encounter (HOSPITAL_COMMUNITY): Payer: Self-pay | Admitting: Psychiatry

## 2017-07-17 VITALS — BP 120/72 | HR 100 | Ht 67.0 in | Wt 175.6 lb

## 2017-07-17 DIAGNOSIS — Z811 Family history of alcohol abuse and dependence: Secondary | ICD-10-CM

## 2017-07-17 DIAGNOSIS — Z634 Disappearance and death of family member: Secondary | ICD-10-CM | POA: Diagnosis not present

## 2017-07-17 DIAGNOSIS — M2550 Pain in unspecified joint: Secondary | ICD-10-CM | POA: Diagnosis not present

## 2017-07-17 DIAGNOSIS — Z736 Limitation of activities due to disability: Secondary | ICD-10-CM

## 2017-07-17 DIAGNOSIS — J449 Chronic obstructive pulmonary disease, unspecified: Secondary | ICD-10-CM | POA: Diagnosis not present

## 2017-07-17 DIAGNOSIS — F330 Major depressive disorder, recurrent, mild: Secondary | ICD-10-CM

## 2017-07-17 DIAGNOSIS — Z87891 Personal history of nicotine dependence: Secondary | ICD-10-CM

## 2017-07-17 DIAGNOSIS — C3412 Malignant neoplasm of upper lobe, left bronchus or lung: Secondary | ICD-10-CM

## 2017-07-17 DIAGNOSIS — M549 Dorsalgia, unspecified: Secondary | ICD-10-CM | POA: Diagnosis not present

## 2017-07-17 MED ORDER — ARIPIPRAZOLE 2 MG PO TABS: 2.0000 mg | ORAL_TABLET | Freq: Every day | ORAL | 1 refills | Status: DC

## 2017-07-17 MED ORDER — AZITHROMYCIN 250 MG PO TABS: ORAL_TABLET | ORAL | 0 refills | Status: AC

## 2017-07-17 NOTE — Progress Notes (Signed)
@Patient  ID: [name], female    DOB: [birth year], 61 y.o.   MRN: [id number]  Chief Complaint  Patient presents with  . Acute Visit    COPD     Referring provider: Deland Pretty, MD  HPI: 61 year old female former smoker seen for pulmonary consult for abnormal chest x-ray September 2017. CT chest showed LUL lesion 2.7cm . PET scan showed  hypermetaolic act in Left apical nodule . She underwent left VATS with wedge resection of the left upper lobe on October 17, 2016 . Found to have Stage 1 adenocarcinoma  CT chest 04/2017 s/p interval resection of previously of LUL .  07/17/2017 Acute OV : COPD  Pt presents for an acute office visit. Complains of 2-3 weeks of cough, congestion , and sinus drainage.  . Seems to be getting worse  Last 3 days . Marland Kitchen Has a lot of drainage in throat. No wheezing , hemoptysis , chest pain or edema.  On Combivent As needed  , rare use.    Allergies  Allergen Reactions  . Carafate [Sucralfate] Hives and Other (See Comments)    CHEST PAIN    . Cefaclor Hives and Other (See Comments)    CHEST PAIN    . Depakote [Divalproex Sodium] Hives and Other (See Comments)    CHEST PAIN    . Nitrofurantoin Hives    CHEST PAIN   . Sulfamethoxazole Hives    CHEST PAIN    . Wellbutrin [Bupropion] Hives and Other (See Comments)    CHEST PAIN     Immunization History  Administered Date(s) Administered  . Influenza Split 07/04/2016  . Influenza Whole 10/22/2007  . Td 04/09/2007    Past Medical History:  Diagnosis Date  . ADD (attention deficit disorder)   . Carotid stenosis   . Carpal tunnel syndrome on both sides   . Cervical spondylosis without myelopathy   . COPD (chronic obstructive pulmonary disease) ([place])   . Costochondritis   . Depression   . Fibromyalgia   . GERD (gastroesophageal reflux disease)   . Heart murmur    dx 20-30 yrs ago.    . Lumbosacral spondylosis without myelopathy   . Lung cancer, left upper lobe ([place]) 09/11/2016     09/2016 PET CT > spiculated left upper lobe nodule, no mediastinal lymphadenopathy, but some CERVICAL lymphadenopathy of undetermined significance  . Migraine    has them 12-15 days a month  . Mitral valve prolapse   . MRSA (methicillin resistant staph aureus) culture positive   . MRSA (methicillin resistant Staphylococcus aureus)    was in her perirectal abscess  back in 2006  . Myalgia and myositis, unspecified   . Palpitations   . Paroxysmal supraventricular tachycardia ([place])   . Pneumonia    of last yr (2016)  . SVT (supraventricular tachycardia) ([place])    very brief runs  . Tobacco abuse     Tobacco History: History  Smoking Status  . Former Smoker  . Packs/day: 0.00  . Years: 30.00  . Quit date: 09/03/2016  Smokeless Tobacco  . Never Used    Comment: ONLY USING VAPE    Counseling given: Not Answered   Outpatient Encounter Prescriptions as of 07/17/2017  Medication Sig  . amitriptyline (ELAVIL) 50 MG tablet Take 50 mg by mouth at bedtime.  . ARIPiprazole (ABILIFY) 2 MG tablet Take 1 tablet (2 mg total) by mouth daily.  . Ascorbic Acid (VITAMIN C) 1000 MG tablet Take 1,000 mg by mouth daily.   Marland Kitchen  aspirin EC 81 MG tablet Take 81 mg by mouth daily.  . Calcium Carbonate-Vit D-Min (CALCIUM 1200 PO) Take 1,200 mg by mouth 2 (two) times daily.  . cetirizine (ZYRTEC) 10 MG tablet Take 10 mg by mouth daily as needed for allergies (alternates between zyrtec and claritin weekly).   Marland Kitchen dexlansoprazole (DEXILANT) 60 MG capsule Take 60 mg by mouth daily.  Marland Kitchen docusate sodium (COLACE) 100 MG capsule Take 100 mg by mouth 2 (two) times daily as needed for constipation.  Marland Kitchen estradiol (ESTRACE) 1 MG tablet Take 1 mg by mouth daily.  Marland Kitchen FIORICET 50-300-40 MG CAPS 1 to 2 every 4-6 hrs as needed (Patient taking differently: Take 1-2 tablets by mouth every 4 (four) hours as needed (migraines). )  . Ginger, Zingiber officinalis, (GINGER ROOT) 550 MG CAPS Take 550 mg by mouth 2 (two) times daily as  needed (nausea).   . Ipratropium-Albuterol (COMBIVENT RESPIMAT) 20-100 MCG/ACT AERS respimat Inhale 1 puff into the lungs every 4 (four) hours as needed for wheezing or shortness of breath.  . loratadine (CLARITIN) 10 MG tablet Take 10 mg by mouth daily as needed for allergies (Alternates between zyrtec and claritin weekly).   . medroxyPROGESTERone (PROVERA) 5 MG tablet Take 5 mg by mouth daily.   . methocarbamol (ROBAXIN) 750 MG tablet Take 750 mg by mouth every 6 (six) hours as needed for muscle spasms.  . Multiple Vitamin (MULTIVITAMIN WITH MINERALS) TABS Take 1 tablet by mouth daily.  Marland Kitchen oxyCODONE-acetaminophen (PERCOCET) 7.5-325 MG per tablet Take 1 tablet by mouth every 4 (four) hours as needed for moderate pain.   . pregabalin (LYRICA) 150 MG capsule Take 150 mg by mouth 2 (two) times daily.  Marland Kitchen pyridoxine (B-6) 100 MG tablet Take 100 mg by mouth 2 (two) times daily.  . ranitidine (ZANTAC) 150 MG tablet Take 150 mg by mouth 2 (two) times daily as needed. If dexilant doesn't work  . triamcinolone ointment (KENALOG) 0.1 % Apply 1 application topically 3 times/day as needed-between meals & bedtime.  . TURMERIC PO Take 550 mg by mouth 3 (three) times daily.   Marland Kitchen venlafaxine (EFFEXOR) 75 MG tablet Take 75 mg by mouth 3 (three) times daily.  Marland Kitchen azithromycin (ZITHROMAX Z-PAK) 250 MG tablet Take 2 tablets (500 mg) on  Day 1,  followed by 1 tablet (250 mg) once daily on Days 2 through 5.   No facility-administered encounter medications on file as of 07/17/2017.      Review of Systems  Constitutional:   No  weight loss, night sweats,  Fevers, chills, fatigue, or  lassitude.  HEENT:   No headaches,  Difficulty swallowing,  Tooth/dental problems, or  Sore throat,                No sneezing, itching, ear ache,  +nasal congestion, post nasal drip,   CV:  No chest pain,  Orthopnea, PND, swelling in lower extremities, anasarca, dizziness, palpitations, syncope.   GI  No heartburn, indigestion,  abdominal pain, nausea, vomiting, diarrhea, change in bowel habits, loss of appetite, bloody stools.   Resp:   No wheezing.  No chest wall deformity  Skin: no rash or lesions.  GU: no dysuria, change in color of urine, no urgency or frequency.  No flank pain, no hematuria   MS:  No joint pain or swelling.  No decreased range of motion.  No back pain.    Physical Exam  BP 100/72 (BP Location: Left Arm, Cuff Size: Normal)  Pulse (!) 102   Ht 5\' 7"  (1.702 m)   Wt 174 lb (78.9 kg)   LMP 09/07/2006 (Within Years) Comment: >10 years  SpO2 98%   BMI 27.25 kg/m   GEN: A/Ox3; pleasant , NAD, thin    HEENT:  [place]/AT,  EACs-clear, TMs-wnl, NOSE-clear, THROAT-clear, no lesions, no postnasal drip or exudate noted.   NECK:  Supple w/ fair ROM; no JVD; normal carotid impulses w/o bruits; no thyromegaly or nodules palpated; no lymphadenopathy.    RESP  Clear  P & A; w/o, wheezes/ rales/ or rhonchi. no accessory muscle use, no dullness to percussion  CARD:  RRR, no m/r/g, no peripheral edema, pulses intact, no cyanosis or clubbing.  GI:   Soft & nt; nml bowel sounds; no organomegaly or masses detected.   Musco: Warm bil, no deformities or joint swelling noted.   Neuro: alert, no focal deficits noted.    Skin: Warm, no lesions or rashes    Lab Results:   BMET  BNP No results found for: BNP  ProBNP No results found for: PROBNP  Imaging: No results found.   Assessment & Plan:   COPD GOLD II Flare with URI   Plan  Patient Instructions  Zpack Take as directed .  Mucinex DM Twice daily  As needed  Cough /congestion .  Zyrtec 10mg  At bedtime  As needed  Drainage .  Saline nasal rinses As needed   Follow up with Dr. Lake Bells in 3-4 months and As needed   Please contact office for sooner follow up if symptoms do not improve or worsen or seek emergency care        Lung cancer, left upper lobe [facility]) S/p resection  Serial follow up CT chest in May w/ no evidence of  recurrence.       Rexene Edison, NP 07/17/2017

## 2017-07-17 NOTE — Assessment & Plan Note (Signed)
Flare with URI   Plan  Patient Instructions  Zpack Take as directed .  Mucinex DM Twice daily  As needed  Cough /congestion .  Zyrtec 10mg  At bedtime  As needed  Drainage .  Saline nasal rinses As needed   Follow up with Dr. Lake Bells in 3-4 months and As needed   Please contact office for sooner follow up if symptoms do not improve or worsen or seek emergency care
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
## 2017-07-17 NOTE — Assessment & Plan Note (Signed)
S/p resection  Serial follow up CT chest in May w/ no evidence of recurrence.

## 2017-07-17 NOTE — Patient Instructions (Addendum)
Zpack Take as directed .  Mucinex DM Twice daily  As needed  Cough /congestion .  Zyrtec 10mg  At bedtime  As needed  Drainage .  Saline nasal rinses As needed   Follow up with Dr. Lake Bells in 3-4 months and As needed   Please contact office for sooner follow up if symptoms do not improve or worsen or seek emergency care

## 2017-07-19 ENCOUNTER — Ambulatory Visit (INDEPENDENT_AMBULATORY_CARE_PROVIDER_SITE_OTHER): Payer: 59 | Admitting: Psychiatry

## 2017-07-19 DIAGNOSIS — F0632 Mood disorder due to known physiological condition with major depressive-like episode: Secondary | ICD-10-CM | POA: Diagnosis not present

## 2017-07-19 DIAGNOSIS — N644 Mastodynia: Secondary | ICD-10-CM | POA: Diagnosis not present

## 2017-07-19 DIAGNOSIS — F900 Attention-deficit hyperactivity disorder, predominantly inattentive type: Secondary | ICD-10-CM

## 2017-07-30 ENCOUNTER — Telehealth (HOSPITAL_COMMUNITY): Payer: Self-pay

## 2017-07-30 NOTE — Telephone Encounter (Signed)
Patient said that she is getting really emotional and that when she takes the Abilify in the morning she feels like she is getting a heat flash and feeling nauseous. She has Zofran for her migraines and she had to use it yesterday. She does not feel she can handle the Abilify. Please review and advise, thank you

## 2017-07-31 ENCOUNTER — Other Ambulatory Visit (HOSPITAL_COMMUNITY): Payer: Self-pay | Admitting: Psychiatry

## 2017-07-31 NOTE — Telephone Encounter (Signed)
She can try taking half tablet .

## 2017-07-31 NOTE — Telephone Encounter (Signed)
I called the patient and let her know what the doctor said and gave her my number to call back with any questions

## 2017-08-07 DIAGNOSIS — R748 Abnormal levels of other serum enzymes: Secondary | ICD-10-CM | POA: Diagnosis not present

## 2017-08-07 DIAGNOSIS — Z0000 Encounter for general adult medical examination without abnormal findings: Secondary | ICD-10-CM | POA: Diagnosis not present

## 2017-08-07 DIAGNOSIS — E7800 Pure hypercholesterolemia, unspecified: Secondary | ICD-10-CM | POA: Diagnosis not present

## 2017-08-07 DIAGNOSIS — E559 Vitamin D deficiency, unspecified: Secondary | ICD-10-CM | POA: Diagnosis not present

## 2017-08-08 DIAGNOSIS — G894 Chronic pain syndrome: Secondary | ICD-10-CM | POA: Diagnosis not present

## 2017-08-08 DIAGNOSIS — R748 Abnormal levels of other serum enzymes: Secondary | ICD-10-CM | POA: Diagnosis not present

## 2017-08-08 DIAGNOSIS — M961 Postlaminectomy syndrome, not elsewhere classified: Secondary | ICD-10-CM | POA: Diagnosis not present

## 2017-08-08 DIAGNOSIS — Z79891 Long term (current) use of opiate analgesic: Secondary | ICD-10-CM | POA: Diagnosis not present

## 2017-08-08 DIAGNOSIS — M47812 Spondylosis without myelopathy or radiculopathy, cervical region: Secondary | ICD-10-CM | POA: Diagnosis not present

## 2017-08-08 DIAGNOSIS — M47816 Spondylosis without myelopathy or radiculopathy, lumbar region: Secondary | ICD-10-CM | POA: Diagnosis not present

## 2017-08-08 DIAGNOSIS — C3412 Malignant neoplasm of upper lobe, left bronchus or lung: Secondary | ICD-10-CM | POA: Diagnosis not present

## 2017-08-08 DIAGNOSIS — M791 Myalgia: Secondary | ICD-10-CM | POA: Diagnosis not present

## 2017-08-08 DIAGNOSIS — Z0000 Encounter for general adult medical examination without abnormal findings: Secondary | ICD-10-CM | POA: Diagnosis not present

## 2017-08-08 DIAGNOSIS — K219 Gastro-esophageal reflux disease without esophagitis: Secondary | ICD-10-CM | POA: Diagnosis not present

## 2017-08-08 DIAGNOSIS — R7989 Other specified abnormal findings of blood chemistry: Secondary | ICD-10-CM | POA: Diagnosis not present

## 2017-08-08 DIAGNOSIS — M810 Age-related osteoporosis without current pathological fracture: Secondary | ICD-10-CM | POA: Diagnosis not present

## 2017-08-08 DIAGNOSIS — M7060 Trochanteric bursitis, unspecified hip: Secondary | ICD-10-CM | POA: Diagnosis not present

## 2017-08-09 ENCOUNTER — Encounter (HOSPITAL_COMMUNITY): Payer: Self-pay | Admitting: Psychiatry

## 2017-08-09 ENCOUNTER — Telehealth (HOSPITAL_COMMUNITY): Payer: Self-pay

## 2017-08-09 ENCOUNTER — Ambulatory Visit (HOSPITAL_COMMUNITY): Payer: Medicare Other | Admitting: Psychiatry

## 2017-08-09 ENCOUNTER — Ambulatory Visit (INDEPENDENT_AMBULATORY_CARE_PROVIDER_SITE_OTHER): Payer: 59 | Admitting: Psychiatry

## 2017-08-09 ENCOUNTER — Ambulatory Visit (INDEPENDENT_AMBULATORY_CARE_PROVIDER_SITE_OTHER): Payer: Medicare Other | Admitting: Psychiatry

## 2017-08-09 VITALS — BP 118/62 | HR 112 | Ht 67.0 in | Wt 177.0 lb

## 2017-08-09 DIAGNOSIS — M549 Dorsalgia, unspecified: Secondary | ICD-10-CM | POA: Diagnosis not present

## 2017-08-09 DIAGNOSIS — F330 Major depressive disorder, recurrent, mild: Secondary | ICD-10-CM

## 2017-08-09 DIAGNOSIS — F900 Attention-deficit hyperactivity disorder, predominantly inattentive type: Secondary | ICD-10-CM

## 2017-08-09 DIAGNOSIS — Z634 Disappearance and death of family member: Secondary | ICD-10-CM | POA: Diagnosis not present

## 2017-08-09 DIAGNOSIS — F419 Anxiety disorder, unspecified: Secondary | ICD-10-CM

## 2017-08-09 DIAGNOSIS — M2550 Pain in unspecified joint: Secondary | ICD-10-CM | POA: Diagnosis not present

## 2017-08-09 DIAGNOSIS — R4583 Excessive crying of child, adolescent or adult: Secondary | ICD-10-CM | POA: Diagnosis not present

## 2017-08-09 DIAGNOSIS — Z87891 Personal history of nicotine dependence: Secondary | ICD-10-CM | POA: Diagnosis not present

## 2017-08-09 DIAGNOSIS — R51 Headache: Secondary | ICD-10-CM | POA: Diagnosis not present

## 2017-08-09 DIAGNOSIS — Z811 Family history of alcohol abuse and dependence: Secondary | ICD-10-CM | POA: Diagnosis not present

## 2017-08-09 DIAGNOSIS — F0632 Mood disorder due to known physiological condition with major depressive-like episode: Secondary | ICD-10-CM

## 2017-08-09 MED ORDER — LAMOTRIGINE 25 MG PO TABS: ORAL_TABLET | ORAL | 1 refills | Status: DC

## 2017-08-09 NOTE — Progress Notes (Signed)
BH MD/PA/NP OP Progress Note  08/09/2017 3:07 PM [name]  MRN:  [id number]  Chief Complaint:  I stop taking Abilify.  It was making me nauseous.  HPI: [name] is 61 year old Caucasian, divorced, disabled female who was seen first time 2 weeks ago for treatment of depression.  She had a long history of depression and recently symptoms started to get worse.  Patient has multiple stressors in recent years.  She is diagnosed with lung cancer and recently had lung surgery.  Her stepfather died, and a month later her biological father died.  She is trying to handle her father states in [place].  She was expressing frustration, irritability, crying spells, poor sleep, anxiety, nervousness and worries.  There are times and when she feel hopeless helpless because of physical health.  She has persistent headaches and she takes multiple pain medication.  She was taking amitriptyline 50 mg and Effexor 300 mg but does not see any improvement.  We tried Abilify 2 mg but she developed nausea and she decided to stop.  Patient is sensitive with the medication.  [name] denies any suicidal thoughts or homicidal thought but remained very anxious, emotional, tearful, easy to cry.  Patient has history of heavy drinking in the past but claims to be sober for past 4 months.  She is open to try a new medication.  She is also seeing therapist Concepcion Elk for counseling.  Her appetite is okay.  Her vital signs are stable.  Visit Diagnosis:    ICD-10-CM   1. Mild episode of recurrent major depressive disorder (HCC) F33.0 lamoTRIgine (LAMICTAL) 25 MG tablet    Past Psychiatric History: Reviewed. Patient has history of depression for long time.  She was diagnosed ADD by Dr. Suszanne Conners but do not recall taking any stimulants.  She had tried Wellbutrin, Prozac, Paxil, Cymbalta, Depakote and trazodone.  Patient denies any history of psychiatric inpatient treatment or any suicidal attempt, hallucination or psychosis.  She  endorse history of heavy drinking many years ago but denies any seizures or any withdrawal symptoms.  Past Medical History:  Past Medical History:  Diagnosis Date  . ADD (attention deficit disorder)   . Carotid stenosis   . Carpal tunnel syndrome on both sides   . Cervical spondylosis without myelopathy   . COPD (chronic obstructive pulmonary disease) ([place])   . Costochondritis   . Depression   . Fibromyalgia   . GERD (gastroesophageal reflux disease)   . Heart murmur    dx 20-30 yrs ago.    . Lumbosacral spondylosis without myelopathy   . Lung cancer, left upper lobe ([place]) 09/11/2016   09/2016 PET CT > spiculated left upper lobe nodule, no mediastinal lymphadenopathy, but some CERVICAL lymphadenopathy of undetermined significance  . Migraine    has them 12-15 days a month  . Mitral valve prolapse   . MRSA (methicillin resistant staph aureus) culture positive   . MRSA (methicillin resistant Staphylococcus aureus)    was in her perirectal abscess  back in 2006  . Myalgia and myositis, unspecified   . Palpitations   . Paroxysmal supraventricular tachycardia ([place])   . Pneumonia    of last yr (2016)  . SVT (supraventricular tachycardia) ([place])    very brief runs  . Tobacco abuse     Past Surgical History:  Procedure Laterality Date  . CHOLECYSTECTOMY    . INCISION AND DRAINAGE PERIRECTAL ABSCESS    . LUMBAR DISC SURGERY     x 2  .  TRIGGER FINGER RELEASE    . VIDEO ASSISTED THORACOSCOPY (VATS)/WEDGE RESECTION Left 10/17/2016   Procedure: VIDEO ASSISTED THORACOSCOPY (VATS)/ MINI THORACOTOMY, LEFT UPPER LOBE WEDGE RESECTION, COMPLETION LOBECTOMY LEFT UPPER LOBE, NODE DISECTION, ON Q PLACEMENT;  Surgeon: [name], MD;  Location: [place];  Service: Thoracic;  Laterality: Left;  Marland Kitchen VIDEO BRONCHOSCOPY N/A 10/17/2016   Procedure: VIDEO BRONCHOSCOPY;  Surgeon: [name], MD;  Location: [place];  Service: Thoracic;  Laterality: N/A;  . WISDOM TOOTH EXTRACTION      Family  Psychiatric History: Reviewed.  Family History:  Family History  Problem Relation Age of Onset  . Hyperlipidemia Mother   . Hypertension Mother   . Alcohol abuse Mother   . Hyperlipidemia Father   . Hypertension Father   . Diabetes Father   . Alcohol abuse Father   . Hypertension Brother   . Hyperlipidemia Brother     Social History:  Social History   Social History  . Marital status: Divorced    Spouse name: N/A  . Number of children: N/A  . Years of education: N/A   Social History Main Topics  . Smoking status: Former Smoker    Packs/day: 0.00    Years: 30.00    Quit date: 09/03/2016  . Smokeless tobacco: Never Used     Comment: ONLY USING VAPE   . Alcohol use No  . Drug use: No  . Sexual activity: Not Asked     Comment: >10 years   Other Topics Concern  . None   Social History Narrative  . None    Allergies:  Allergies  Allergen Reactions  . Carafate [Sucralfate] Hives and Other (See Comments)    CHEST PAIN    . Cefaclor Hives and Other (See Comments)    CHEST PAIN    . Depakote [Divalproex Sodium] Hives and Other (See Comments)    CHEST PAIN    . Nitrofurantoin Hives    CHEST PAIN   . Sulfamethoxazole Hives    CHEST PAIN    . Wellbutrin [Bupropion] Hives and Other (See Comments)    CHEST PAIN     Metabolic Disorder Labs: No results found for: HGBA1C, MPG No results found for: PROLACTIN Lab Results  Component Value Date   CHOL 182 04/09/2007   TRIG 68 04/09/2007   HDL 51 04/09/2007   LDLCALC 117 04/09/2007   No results found for: TSH  Therapeutic Level Labs: No results found for: LITHIUM No results found for: VALPROATE No components found for:  CBMZ  Current Medications: Current Outpatient Prescriptions  Medication Sig Dispense Refill  . amitriptyline (ELAVIL) 50 MG tablet Take 50 mg by mouth at bedtime.    . Ascorbic Acid (VITAMIN C) 1000 MG tablet Take 1,000 mg by mouth daily.     Marland Kitchen aspirin EC 81 MG tablet Take 81 mg by mouth  daily.    . Calcium Carbonate-Vit D-Min (CALCIUM 1200 PO) Take 1,200 mg by mouth 2 (two) times daily.    . cetirizine (ZYRTEC) 10 MG tablet Take 10 mg by mouth daily as needed for allergies (alternates between zyrtec and claritin weekly).     Marland Kitchen dexlansoprazole (DEXILANT) 60 MG capsule Take 60 mg by mouth daily.    Marland Kitchen docusate sodium (COLACE) 100 MG capsule Take 100 mg by mouth 2 (two) times daily as needed for constipation.    Marland Kitchen estradiol (ESTRACE) 1 MG tablet Take 1 mg by mouth daily.    Marland Kitchen FIORICET 50-300-40 MG CAPS  1 to 2 every 4-6 hrs as needed (Patient taking differently: Take 1-2 tablets by mouth every 4 (four) hours as needed (migraines). ) 60 capsule 0  . Ginger, Zingiber officinalis, (GINGER ROOT) 550 MG CAPS Take 550 mg by mouth 2 (two) times daily as needed (nausea).     . Ipratropium-Albuterol (COMBIVENT RESPIMAT) 20-100 MCG/ACT AERS respimat Inhale 1 puff into the lungs every 4 (four) hours as needed for wheezing or shortness of breath.    . loratadine (CLARITIN) 10 MG tablet Take 10 mg by mouth daily as needed for allergies (Alternates between zyrtec and claritin weekly).     . medroxyPROGESTERone (PROVERA) 5 MG tablet Take 5 mg by mouth daily.     . methocarbamol (ROBAXIN) 750 MG tablet Take 750 mg by mouth every 6 (six) hours as needed for muscle spasms.    . Multiple Vitamin (MULTIVITAMIN WITH MINERALS) TABS Take 1 tablet by mouth daily.    Marland Kitchen oxyCODONE-acetaminophen (PERCOCET) 7.5-325 MG per tablet Take 1 tablet by mouth every 4 (four) hours as needed for moderate pain.     . pregabalin (LYRICA) 150 MG capsule Take 150 mg by mouth 2 (two) times daily.    Marland Kitchen pyridoxine (B-6) 100 MG tablet Take 100 mg by mouth 2 (two) times daily.    . ranitidine (ZANTAC) 150 MG tablet Take 150 mg by mouth 2 (two) times daily as needed. If dexilant doesn't work    . triamcinolone ointment (KENALOG) 0.1 % Apply 1 application topically 3 times/day as needed-between meals & bedtime.    . TURMERIC PO Take  550 mg by mouth 3 (three) times daily.     Marland Kitchen venlafaxine (EFFEXOR) 75 MG tablet Take 75 mg by mouth 3 (three) times daily.    . ARIPiprazole (ABILIFY) 2 MG tablet Take 1 tablet (2 mg total) by mouth daily. (Patient not taking: Reported on 08/09/2017) 30 tablet 1   No current facility-administered medications for this visit.      Musculoskeletal: Strength & Muscle Tone: within normal limits Gait & Station: normal Patient leans: N/A  Psychiatric Specialty Exam: Review of Systems  Constitutional: Negative.   HENT: Negative.   Respiratory: Negative.   Cardiovascular: Negative.   Musculoskeletal: Positive for back pain and joint pain.  Skin: Negative.   Neurological: Positive for tingling and headaches.  Psychiatric/Behavioral: Positive for depression. The patient is nervous/anxious.     Blood pressure 118/62, pulse (!) 112, height 5\' 7"  (1.702 m), weight 177 lb (80.3 kg), last menstrual period 09/07/2006.Body mass index is 27.72 kg/m.  General Appearance: Casual  Eye Contact:  Good  Speech:  Clear and Coherent  Volume:  Normal  Mood:  Depressed  Affect:  Appropriate  Thought Process:  Goal Directed  Orientation:  Full (Time, Place, and Person)  Thought Content: Rumination   Suicidal Thoughts:  No  Homicidal Thoughts:  No  Memory:  Immediate;   Good Recent;   Good Remote;   Good  Judgement:  Good  Insight:  Good  Psychomotor Activity:  Normal  Concentration:  Concentration: Fair and Attention Span: Fair  Recall:  Good  Fund of Knowledge: Good  Language: Good  Akathisia:  No  Handed:  Right  AIMS (if indicated): not done  Assets:  Communication Skills Desire for Improvement Housing Resilience Social Support  ADL's:  Intact  Cognition: WNL  Sleep:  Fair   Screenings:   Assessment and Plan: Major depressive disorder, recurrent.  I review her symptoms, collateral information from other  providers , psychosocial stressors and current medication.  I would discontinue  Abilify due to nausea.  She is taking Effexor 75 mg twice a day which was reduce on her last appointment.  I will try Lamictal 25 mg daily for 1 week and then 50 mg daily.  Discuss in detail medication side effects especially rash in that case she needed to stop the medication immediately.  She will continue amitriptyline 50 mg prescribed by her primary care physician.  Encouraged to keep appointment with a therapist Concepcion Elk for counseling.  Recommended to call us back if she has any question, concern or if she feels worsening of the symptom.  Follow-up in 6 weeks.  Time spent 30 minutes.   Bobbi Kozakiewicz T., MD 08/09/2017, 3:07 PM

## 2017-09-04 DIAGNOSIS — M8580 Other specified disorders of bone density and structure, unspecified site: Secondary | ICD-10-CM | POA: Diagnosis not present

## 2017-09-05 DIAGNOSIS — M47812 Spondylosis without myelopathy or radiculopathy, cervical region: Secondary | ICD-10-CM | POA: Diagnosis not present

## 2017-09-05 DIAGNOSIS — G894 Chronic pain syndrome: Secondary | ICD-10-CM | POA: Diagnosis not present

## 2017-09-05 DIAGNOSIS — M47816 Spondylosis without myelopathy or radiculopathy, lumbar region: Secondary | ICD-10-CM | POA: Diagnosis not present

## 2017-09-05 DIAGNOSIS — M961 Postlaminectomy syndrome, not elsewhere classified: Secondary | ICD-10-CM | POA: Diagnosis not present

## 2017-09-06 ENCOUNTER — Ambulatory Visit (INDEPENDENT_AMBULATORY_CARE_PROVIDER_SITE_OTHER): Payer: Medicare Other | Admitting: Psychiatry

## 2017-09-06 DIAGNOSIS — F900 Attention-deficit hyperactivity disorder, predominantly inattentive type: Secondary | ICD-10-CM | POA: Diagnosis not present

## 2017-09-06 DIAGNOSIS — F0632 Mood disorder due to known physiological condition with major depressive-like episode: Secondary | ICD-10-CM | POA: Diagnosis not present

## 2017-09-10 ENCOUNTER — Telehealth (HOSPITAL_COMMUNITY): Payer: Self-pay

## 2017-09-10 NOTE — Telephone Encounter (Signed)
Patient is calling because she has been having chest pain since starting the Lamictal. She said this happens when she is having a reaction to the medication. She would like to know if she can come off of it. Please review and advise, thank you

## 2017-09-11 ENCOUNTER — Other Ambulatory Visit (HOSPITAL_COMMUNITY): Payer: Self-pay | Admitting: Psychiatry

## 2017-09-11 NOTE — Telephone Encounter (Signed)
She should stop the Lamictal and if chest pain do not resolve then she should go to the emergency room.

## 2017-09-11 NOTE — Telephone Encounter (Signed)
I called patient and advised her to stop the Lamictal and that if her chest pain continues then she will need to go to the ED. Patient verbalized her understanding and will call if she has any questions

## 2017-09-27 ENCOUNTER — Other Ambulatory Visit: Payer: Self-pay | Admitting: Cardiothoracic Surgery

## 2017-09-27 DIAGNOSIS — C3412 Malignant neoplasm of upper lobe, left bronchus or lung: Secondary | ICD-10-CM

## 2017-10-03 ENCOUNTER — Telehealth (HOSPITAL_COMMUNITY): Payer: Self-pay

## 2017-10-03 DIAGNOSIS — M47812 Spondylosis without myelopathy or radiculopathy, cervical region: Secondary | ICD-10-CM | POA: Diagnosis not present

## 2017-10-03 DIAGNOSIS — M961 Postlaminectomy syndrome, not elsewhere classified: Secondary | ICD-10-CM | POA: Diagnosis not present

## 2017-10-03 DIAGNOSIS — G894 Chronic pain syndrome: Secondary | ICD-10-CM | POA: Diagnosis not present

## 2017-10-03 DIAGNOSIS — M47816 Spondylosis without myelopathy or radiculopathy, lumbar region: Secondary | ICD-10-CM | POA: Diagnosis not present

## 2017-10-03 NOTE — Telephone Encounter (Signed)
Need appointment

## 2017-10-03 NOTE — Telephone Encounter (Signed)
The pharmacy sent a fax refill request for LamoTRIgine 25mg . Patient was las seen on 08/09/17 and does not have a future appointment scheduled. Please review and advise.

## 2017-10-03 NOTE — Telephone Encounter (Signed)
I called the patient and she said that she is not taking the medication because she had a reaction. I offered to make her an appointment and she said she would like to wait awhile.

## 2017-10-08 ENCOUNTER — Ambulatory Visit: Payer: Medicare Other | Admitting: Cardiovascular Disease

## 2017-10-08 ENCOUNTER — Other Ambulatory Visit: Payer: Self-pay | Admitting: Pulmonary Disease

## 2017-10-08 ENCOUNTER — Encounter: Payer: Self-pay | Admitting: Cardiovascular Disease

## 2017-10-08 VITALS — BP 106/82 | HR 91 | Ht 67.0 in | Wt 181.0 lb

## 2017-10-08 DIAGNOSIS — I2510 Atherosclerotic heart disease of native coronary artery without angina pectoris: Secondary | ICD-10-CM

## 2017-10-08 DIAGNOSIS — I471 Supraventricular tachycardia: Secondary | ICD-10-CM

## 2017-10-08 DIAGNOSIS — J449 Chronic obstructive pulmonary disease, unspecified: Secondary | ICD-10-CM

## 2017-10-08 DIAGNOSIS — I4719 Other supraventricular tachycardia: Secondary | ICD-10-CM

## 2017-10-08 NOTE — Progress Notes (Signed)
Cardiology Office Note    Date:  10/08/2017   ID:  KYSHA MURALLES, DOB 06/23/1956, MRN [id number]  PCP:  Deland Pretty, MD  Cardiologist:   Sanda Klein, MD   Chief Complaint  Patient presents with  . Follow-up    1 year  . Chest Pain  . Headache  . Shortness of Breath    History of Present Illness:  [name] is a 61 y.o. female with ectopic atrial tachycardia, reported history of mitral valve prolapse, moderate COPD, mild hyperlipidemia, strong family history of coronary disease, migraines and fibromyalgia, now roughly 1 year following resection of an adenocarcinoma of the left upper lung.  Incidental finding of coronary calcification on chest CT led to further workup with a normal myocardial perfusion study (November 2017).  Has occasional chest discomfort that is clearly due to costochondritis, she reports being tender to palpation and improving with nonsteroidal anti-inflammatory drugs.  Palpitations have not bothered her recently, especially since she has been limiting her use of bronchodilators.  She has successfully quit smoking: although she still uses a vape device, she has weaned down to 0 nicotine content.  She is walking frequently and does not feel limited by cardiovascular complaints  The patient specifically denies any chest pain at rest exertion, dyspnea at rest or with exertion, orthopnea, paroxysmal nocturnal dyspnea, syncope, palpitations, focal neurological deficits, intermittent claudication, lower extremity edema, unexplained weight gain, cough, hemoptysis or wheezing.   Past Medical History:  Diagnosis Date  . ADD (attention deficit disorder)   . Carotid stenosis   . Carpal tunnel syndrome on both sides   . Cervical spondylosis without myelopathy   . COPD (chronic obstructive pulmonary disease) ([place])   . Costochondritis   . Depression   . Fibromyalgia   . GERD (gastroesophageal reflux disease)   . Heart murmur    dx 20-30 yrs  ago.    . Lumbosacral spondylosis without myelopathy   . Lung cancer, left upper lobe ([place]) 09/11/2016   09/2016 PET CT > spiculated left upper lobe nodule, no mediastinal lymphadenopathy, but some CERVICAL lymphadenopathy of undetermined significance  . Migraine    has them 12-15 days a month  . Mitral valve prolapse   . MRSA (methicillin resistant staph aureus) culture positive   . MRSA (methicillin resistant Staphylococcus aureus)    was in her perirectal abscess  back in 2006  . Myalgia and myositis, unspecified   . Palpitations   . Paroxysmal supraventricular tachycardia ([place])   . Pneumonia    of last yr (2016)  . SVT (supraventricular tachycardia) ([place])    very brief runs  . Tobacco abuse     Past Surgical History:  Procedure Laterality Date  . CHOLECYSTECTOMY    . INCISION AND DRAINAGE PERIRECTAL ABSCESS    . LUMBAR DISC SURGERY     x 2  . TRIGGER FINGER RELEASE    . WISDOM TOOTH EXTRACTION      Current Medications: Outpatient Medications Prior to Visit  Medication Sig Dispense Refill  . amitriptyline (ELAVIL) 50 MG tablet Take 50 mg by mouth at bedtime.    . Ascorbic Acid (VITAMIN C) 1000 MG tablet Take 1,000 mg by mouth daily.     Marland Kitchen aspirin EC 81 MG tablet Take 81 mg by mouth daily.    . Calcium Carbonate-Vit D-Min (CALCIUM 1200 PO) Take 1,200 mg by mouth 2 (two) times daily.    . cetirizine (ZYRTEC) 10 MG tablet Take 10 mg by mouth daily  as needed for allergies (alternates between zyrtec and claritin weekly).     Marland Kitchen dexlansoprazole (DEXILANT) 60 MG capsule Take 60 mg by mouth daily.    Marland Kitchen docusate sodium (COLACE) 100 MG capsule Take 100 mg by mouth 2 (two) times daily as needed for constipation.    Marland Kitchen estradiol (ESTRACE) 1 MG tablet Take 1 mg by mouth daily.    Marland Kitchen FIORICET 50-300-40 MG CAPS 1 to 2 every 4-6 hrs as needed (Patient taking differently: Take 1-2 tablets by mouth every 4 (four) hours as needed (migraines). ) 60 capsule 0  . Ginger, Zingiber officinalis,  (GINGER ROOT) 550 MG CAPS Take 550 mg by mouth 2 (two) times daily as needed (nausea).     . Ipratropium-Albuterol (COMBIVENT RESPIMAT) 20-100 MCG/ACT AERS respimat Inhale 1 puff into the lungs every 4 (four) hours as needed for wheezing or shortness of breath.    . loratadine (CLARITIN) 10 MG tablet Take 10 mg by mouth daily as needed for allergies (Alternates between zyrtec and claritin weekly).     . medroxyPROGESTERone (PROVERA) 5 MG tablet Take 5 mg by mouth daily.     . methocarbamol (ROBAXIN) 750 MG tablet Take 750 mg by mouth every 6 (six) hours as needed for muscle spasms.    . Multiple Vitamin (MULTIVITAMIN WITH MINERALS) TABS Take 1 tablet by mouth daily.    Marland Kitchen oxyCODONE-acetaminophen (PERCOCET) 7.5-325 MG per tablet Take 1 tablet by mouth every 4 (four) hours as needed for moderate pain.     . pregabalin (LYRICA) 150 MG capsule Take 150 mg by mouth 2 (two) times daily.    Marland Kitchen pyridoxine (B-6) 100 MG tablet Take 100 mg by mouth 2 (two) times daily.    . ranitidine (ZANTAC) 150 MG tablet Take 150 mg by mouth 2 (two) times daily as needed. If dexilant doesn't work    . triamcinolone ointment (KENALOG) 0.1 % Apply 1 application topically 3 times/day as needed-between meals & bedtime.    . TURMERIC PO Take 550 mg by mouth 3 (three) times daily.     Marland Kitchen venlafaxine (EFFEXOR) 75 MG tablet Take 75 mg by mouth 3 (three) times daily.    . ARIPiprazole (ABILIFY) 2 MG tablet Take 1 tablet (2 mg total) by mouth daily. (Patient not taking: Reported on 08/09/2017) 30 tablet 1  . lamoTRIgine (LAMICTAL) 25 MG tablet Take 1 tab daily for 1 week and than 2 tab daily 60 tablet 1   No facility-administered medications prior to visit.      Allergies:   Carafate [sucralfate]; Cefaclor; Depakote [divalproex sodium]; Lamictal [lamotrigine]; Nitrofurantoin; Sulfamethoxazole; Wellbutrin [bupropion]; and Abilify [aripiprazole]   Social History   Socioeconomic History  . Marital status: Divorced    Spouse name:  None  . Number of children: None  . Years of education: None  . Highest education level: None  Social Needs  . Financial resource strain: None  . Food insecurity - worry: None  . Food insecurity - inability: None  . Transportation needs - medical: None  . Transportation needs - non-medical: None  Occupational History  . None  Tobacco Use  . Smoking status: Former Smoker    Packs/day: 0.00    Years: 30.00    Pack years: 0.00    Last attempt to quit: 09/03/2016    Years since quitting: 1.0  . Smokeless tobacco: Never Used  . Tobacco comment: ONLY USING VAPE   Substance and Sexual Activity  . Alcohol use: No  . Drug  use: No  . Sexual activity: None    Comment: >10 years  Other Topics Concern  . None  Social History Narrative  . None     Family History:  The patient's family history includes Alcohol abuse in her father and mother; Diabetes in her father; Hyperlipidemia in her brother, father, and mother; Hypertension in her brother, father, and mother.   ROS:   Please see the history of present illness.    ROS All other systems reviewed and are negative.   PHYSICAL EXAM:   VS:  BP 106/82   Pulse 91   Ht 5\' 7"  (1.702 m)   Wt 181 lb (82.1 kg)   LMP 09/07/2006 (Within Years) Comment: >10 years  BMI 28.35 kg/m     General: Alert, oriented x3, no distress, mildly overweight Head: no evidence of trauma, PERRL, EOMI, no exophtalmos or lid lag, no myxedema, no xanthelasma; normal ears, nose and oropharynx Neck: normal jugular venous pulsations and no hepatojugular reflux; brisk carotid pulses without delay and no carotid bruits Chest: clear to auscultation, no signs of consolidation by percussion or palpation, normal fremitus, symmetrical and full respiratory excursions Cardiovascular: normal position and quality of the apical impulse, regular rhythm, normal first and second heart sounds, no murmurs, rubs or gallops Abdomen: no tenderness or distention, no masses by palpation,  no abnormal pulsatility or arterial bruits, normal bowel sounds, no hepatosplenomegaly Extremities: no clubbing, cyanosis or edema; 2+ radial, ulnar and brachial pulses bilaterally; 2+ right femoral, posterior tibial and dorsalis pedis pulses; 2+ left femoral, posterior tibial and dorsalis pedis pulses; no subclavian or femoral bruits Neurological: grossly nonfocal Psych: Normal mood and affect   Wt Readings from Last 3 Encounters:  10/08/17 181 lb (82.1 kg)  07/17/17 174 lb (78.9 kg)  04/26/17 173 lb (78.5 kg)      Studies/Labs Reviewed:   EKG:  EKG is ordered today.  The ekg ordered today demonstrates sinus rhythm, normal tracing Recent Labs: 10/19/2016: ALT 15; BUN 5; Creatinine, Ser 0.64; Hemoglobin 12.8; Platelets 207; Potassium 4.1; Sodium 138   Lipid Panel    Component Value Date/Time   CHOL 182 04/09/2007   TRIG 68 04/09/2007   HDL 51 04/09/2007   [place] 117 04/09/2007    Additional studies/ records that were reviewed today include:  Notes from follow-up visit with Dr. Servando Snare in March 2018, CT chest 2018  Labs September 2018 TSH 0.552, normal LFTs, creatinine 0.73, glucose 97, globin 13.3 Cholesterol 164, HDL 64, LDL 87, triglycerides 64  ASSESSMENT:    No diagnosis found.   PLAN:  In order of problems listed above:  1. Coronary calcification: Since she has quit smoking and has an excellent lipid profile, hopefully there will be no progression of her coronary atherosclerosis.  Recent nuclear stress test shows no evidence of ischemia.   2. PAT: Palpitations have not bothered her in the last several months. 3. COPD: Successful resection of left upper lobe for adenocarcinoma, without evidence of lymph node involvement or any evidence of recurrence 1 year later. 4. MVP: Without evidence of murmur on today's exam, with provocative maneuvers.    Medication Adjustments/Labs and Tests Ordered: Current medicines are reviewed at length with the patient today.   Concerns regarding medicines are outlined above.  Medication changes, Labs and Tests ordered today are listed in the Patient Instructions below. Patient Instructions  Dr Sallyanne Kuster recommends that you schedule a follow-up appointment in 12 months. You will receive a reminder letter in the mail two  months in advance. If you don't receive a letter, please call our office to schedule the follow-up appointment.  If you need a refill on your cardiac medications before your next appointment, please call your pharmacy.    Signed, Sanda Klein, MD  10/08/2017 12:41 PM    [address], [place], [place]  [address] Phone: [phone]; Fax: [phone]

## 2017-10-08 NOTE — Patient Instructions (Signed)
Dr Croitoru recommends that you schedule a follow-up appointment in 12 months. You will receive a reminder letter in the mail two months in advance. If you don't receive a letter, please call our office to schedule the follow-up appointment.  If you need a refill on your cardiac medications before your next appointment, please call your pharmacy. 

## 2017-10-09 ENCOUNTER — Ambulatory Visit: Payer: Medicare Other | Admitting: Psychiatry

## 2017-10-09 DIAGNOSIS — F900 Attention-deficit hyperactivity disorder, predominantly inattentive type: Secondary | ICD-10-CM | POA: Diagnosis not present

## 2017-10-09 DIAGNOSIS — F0632 Mood disorder due to known physiological condition with major depressive-like episode: Secondary | ICD-10-CM

## 2017-10-09 NOTE — Addendum Note (Signed)
Addended by: Diana Eves on: 10/09/2017 02:41 PM   Modules accepted: Orders

## 2017-10-10 ENCOUNTER — Ambulatory Visit (HOSPITAL_COMMUNITY): Payer: Self-pay | Admitting: Psychiatry

## 2017-10-11 ENCOUNTER — Other Ambulatory Visit: Payer: Self-pay

## 2017-10-11 ENCOUNTER — Ambulatory Visit: Payer: Medicare Other | Admitting: Cardiothoracic Surgery

## 2017-10-11 ENCOUNTER — Encounter: Payer: Self-pay | Admitting: Cardiothoracic Surgery

## 2017-10-11 ENCOUNTER — Ambulatory Visit
Admission: RE | Admit: 2017-10-11 | Discharge: 2017-10-11 | Disposition: A | Discharge status: Home / Self Care | Payer: Medicare Other | Source: Ambulatory Visit | Attending: Cardiothoracic Surgery | Admitting: Cardiothoracic Surgery

## 2017-10-11 VITALS — BP 114/80 | HR 104 | Resp 16 | Ht 67.0 in | Wt 181.0 lb

## 2017-10-11 DIAGNOSIS — C3492 Malignant neoplasm of unspecified part of left bronchus or lung: Secondary | ICD-10-CM | POA: Diagnosis not present

## 2017-10-11 DIAGNOSIS — C3412 Malignant neoplasm of upper lobe, left bronchus or lung: Secondary | ICD-10-CM

## 2017-10-11 DIAGNOSIS — J439 Emphysema, unspecified: Secondary | ICD-10-CM | POA: Diagnosis not present

## 2017-10-11 DIAGNOSIS — Z902 Acquired absence of lung [part of]: Secondary | ICD-10-CM | POA: Diagnosis not present

## 2017-10-11 NOTE — Progress Notes (Signed)
[address]       [place] [address]             [phone]      Chase L Slaydon [facility] Medical Record #[id number] Date of Birth: 11-Feb-1956  Referring: Juanito Doom, MD Primary Care: Deland Pretty, MD  Chief Complaint:   POST OP FOLLOW UP 10/17/2016 OPERATIVE REPORT PREOPERATIVE DIAGNOSIS: Left upper lobe lung mass. POSTOPERATIVE DIAGNOSIS: Adenocarcinoma of the left upper lobe. PROCEDURE PERFORMED: Video bronchoscopy, left video-assisted thoracoscopy, mini thoracotomy, wedge resection of left upper lobe, completion of left upper lobectomy with lymph node dissection and placement of On-Q device, and lymph node dissection. SURGEON: Lanelle Bal, M.D.  Cancer Staging Lung cancer, left upper lobe ([place]) Staging form: Lung, AJCC 7th Edition - Pathologic stage from 10/17/2016: Stage IA (T1b, N0, cM0) - Signed by [name], MD on 10/18/2016    History of Present Illness:     Patient underwent left upper lobectomy for stage IA adenocarcinoma the lung in November 2017. She now returns for a six-month follow-up CT scan.     Past Medical History:  Diagnosis Date  . ADD (attention deficit disorder)   . Carotid stenosis   . Carpal tunnel syndrome on both sides   . Cervical spondylosis without myelopathy   . COPD (chronic obstructive pulmonary disease) ([place])   . Costochondritis   . Depression   . Fibromyalgia   . GERD (gastroesophageal reflux disease)   . Heart murmur    dx 20-30 yrs ago.    . Lumbosacral spondylosis without myelopathy   . Lung cancer, left upper lobe ([place]) 09/11/2016   09/2016 PET CT > spiculated left upper lobe nodule, no mediastinal lymphadenopathy, but some CERVICAL lymphadenopathy of undetermined significance  . Migraine    has them 12-15 days a month  . Mitral valve prolapse   . MRSA (methicillin resistant staph aureus) culture positive   . MRSA (methicillin resistant Staphylococcus aureus)    was in her perirectal abscess  back in 2006  . Myalgia and myositis, unspecified   . Palpitations   . Paroxysmal supraventricular tachycardia ([place])   . Pneumonia    of last yr (2016)  . SVT (supraventricular tachycardia) ([place])    very brief runs  . Tobacco abuse      Social History   Tobacco Use  Smoking Status Former Smoker  . Packs/day: 0.00  . Years: 30.00  . Pack years: 0.00  . Last attempt to quit: 09/03/2016  . Years since quitting: 1.1  Smokeless Tobacco Never Used  Tobacco Comment   ONLY USING VAPE     Social History   Substance and Sexual Activity  Alcohol Use No     Allergies  Allergen Reactions  . Carafate [Sucralfate] Hives and Other (See Comments)    CHEST PAIN    . Cefaclor Hives and Other (See Comments)    CHEST PAIN    . Depakote [Divalproex Sodium] Hives and Other (See Comments)    CHEST PAIN    . Lamictal [Lamotrigine] Other (See Comments)    Chest pain.  . Nitrofurantoin Hives    CHEST PAIN   . Sulfamethoxazole Hives    CHEST PAIN    . Wellbutrin [Bupropion] Hives and Other (See Comments)    CHEST PAIN   . Abilify [Aripiprazole] Nausea Only    Current Outpatient Medications  Medication Sig Dispense Refill  . amitriptyline (ELAVIL) 50 MG tablet Take 50 mg by  mouth at bedtime.    . Ascorbic Acid (VITAMIN C) 1000 MG tablet Take 1,000 mg by mouth daily.     Marland Kitchen aspirin EC 81 MG tablet Take 81 mg by mouth daily.    . Calcium Carbonate-Vit D-Min (CALCIUM 1200 PO) Take 1,200 mg by mouth 2 (two) times daily.    . cetirizine (ZYRTEC) 10 MG tablet Take 10 mg by mouth daily as needed for allergies (alternates between zyrtec and claritin weekly).     . COMBIVENT RESPIMAT 20-100 MCG/ACT AERS respimat INHALE 2 PUFFS INTO THE LUNGS 4 TIMES DAILY AS NEEDED FOR WHEEZING 3 Inhaler 1  . dexlansoprazole (DEXILANT) 60 MG capsule Take 60 mg by mouth daily.    Marland Kitchen docusate sodium (COLACE) 100 MG capsule Take 100 mg by mouth 2 (two) times daily as needed for  constipation.    Marland Kitchen estradiol (ESTRACE) 1 MG tablet Take 1 mg by mouth daily.    Marland Kitchen FIORICET 50-300-40 MG CAPS 1 to 2 every 4-6 hrs as needed (Patient taking differently: Take 1-2 tablets by mouth every 4 (four) hours as needed (migraines). ) 60 capsule 0  . Ginger, Zingiber officinalis, (GINGER ROOT) 550 MG CAPS Take 550 mg by mouth 2 (two) times daily as needed (nausea).     . loratadine (CLARITIN) 10 MG tablet Take 10 mg by mouth daily as needed for allergies (Alternates between zyrtec and claritin weekly).     . medroxyPROGESTERone (PROVERA) 5 MG tablet Take 5 mg by mouth daily.     . methocarbamol (ROBAXIN) 750 MG tablet Take 750 mg by mouth every 6 (six) hours as needed for muscle spasms.    . Multiple Vitamin (MULTIVITAMIN WITH MINERALS) TABS Take 1 tablet by mouth daily.    Marland Kitchen oxyCODONE-acetaminophen (PERCOCET) 7.5-325 MG per tablet Take 1 tablet by mouth every 4 (four) hours as needed for moderate pain.     . pregabalin (LYRICA) 150 MG capsule Take 150 mg by mouth 2 (two) times daily.    Marland Kitchen pyridoxine (B-6) 100 MG tablet Take 100 mg by mouth 2 (two) times daily.    . ranitidine (ZANTAC) 150 MG tablet Take 150 mg by mouth 2 (two) times daily as needed. If dexilant doesn't work    . triamcinolone ointment (KENALOG) 0.1 % Apply 1 application topically 3 times/day as needed-between meals & bedtime.    . TURMERIC PO Take 550 mg by mouth 3 (three) times daily.     Marland Kitchen venlafaxine (EFFEXOR) 75 MG tablet Take 150 mg 2 (two) times daily by mouth.      No current facility-administered medications for this visit.        Physical Exam: BP 114/80 (BP Location: Right Arm, Patient Position: Sitting, Cuff Size: Large)   Pulse (!) 104   Resp 16   Ht 5\' 7"  (1.702 m)   Wt 181 lb (82.1 kg)   LMP 09/07/2006 (Within Years) Comment: >10 years  SpO2 98% Comment: ON RA  BMI 28.35 kg/m   General appearance: alert and cooperative Neurologic: intact Heart: regular rate and rhythm, S1, S2 normal, no murmur,  click, rub or gallop Lungs: clear to auscultation bilaterally and normal percussion bilaterally Abdomen: soft, non-tender; bowel sounds normal; no masses,  no organomegaly Extremities: extremities normal, atraumatic, no cyanosis or edema and Homans sign is negative, no sign of DVT Wound: Left chest port sites incisions are well-healed patient has no cervical supraclavicular adenopathy   Diagnostic Studies & Laboratory data:     Recent Radiology  Findings:  No results found.    Recent Lab Findings: Lab Results  Component Value Date   WBC 9.6 10/19/2016   HGB 12.8 10/19/2016   HCT 39.2 10/19/2016   PLT 207 10/19/2016   GLUCOSE 117 (H) 10/19/2016   CHOL 182 04/09/2007   TRIG 68 04/09/2007   HDL 51 04/09/2007   LDLCALC 117 04/09/2007   ALT 15 10/19/2016   AST 24 10/19/2016   NA 138 10/19/2016   K 4.1 10/19/2016   CL 104 10/19/2016   CREATININE 0.64 10/19/2016   BUN 5 (L) 10/19/2016   CO2 29 10/19/2016   INR 1.05 10/16/2016      Assessment / Plan:      Patient doing well now approximate 12 months after resection of stage I adenocarcinoma with left upper lobectomy, no evidence of recurrence on follow-up CT scan- I plan to see her back in 6 months with a follow-up CT She continues to be tobacco free.    [name] MD      [address] [place] [address] Office [phone]   [facility]eeper [phone]  10/11/2017 4:26 PM

## 2017-10-12 ENCOUNTER — Ambulatory Visit: Payer: Medicare Other | Admitting: Cardiothoracic Surgery

## 2017-10-28 IMAGING — CR DG CHEST 2V
2 series · 2 of 2 positions shown · non-contrast
Comparison: Chest x-ray of 11/21/2016

CLINICAL DATA: Post left upper lobectomy for resection of
adenocarcinoma on 10/17/2016, now with some shortness of breath

EXAM:
CHEST  2 VIEW

[w chest pa]
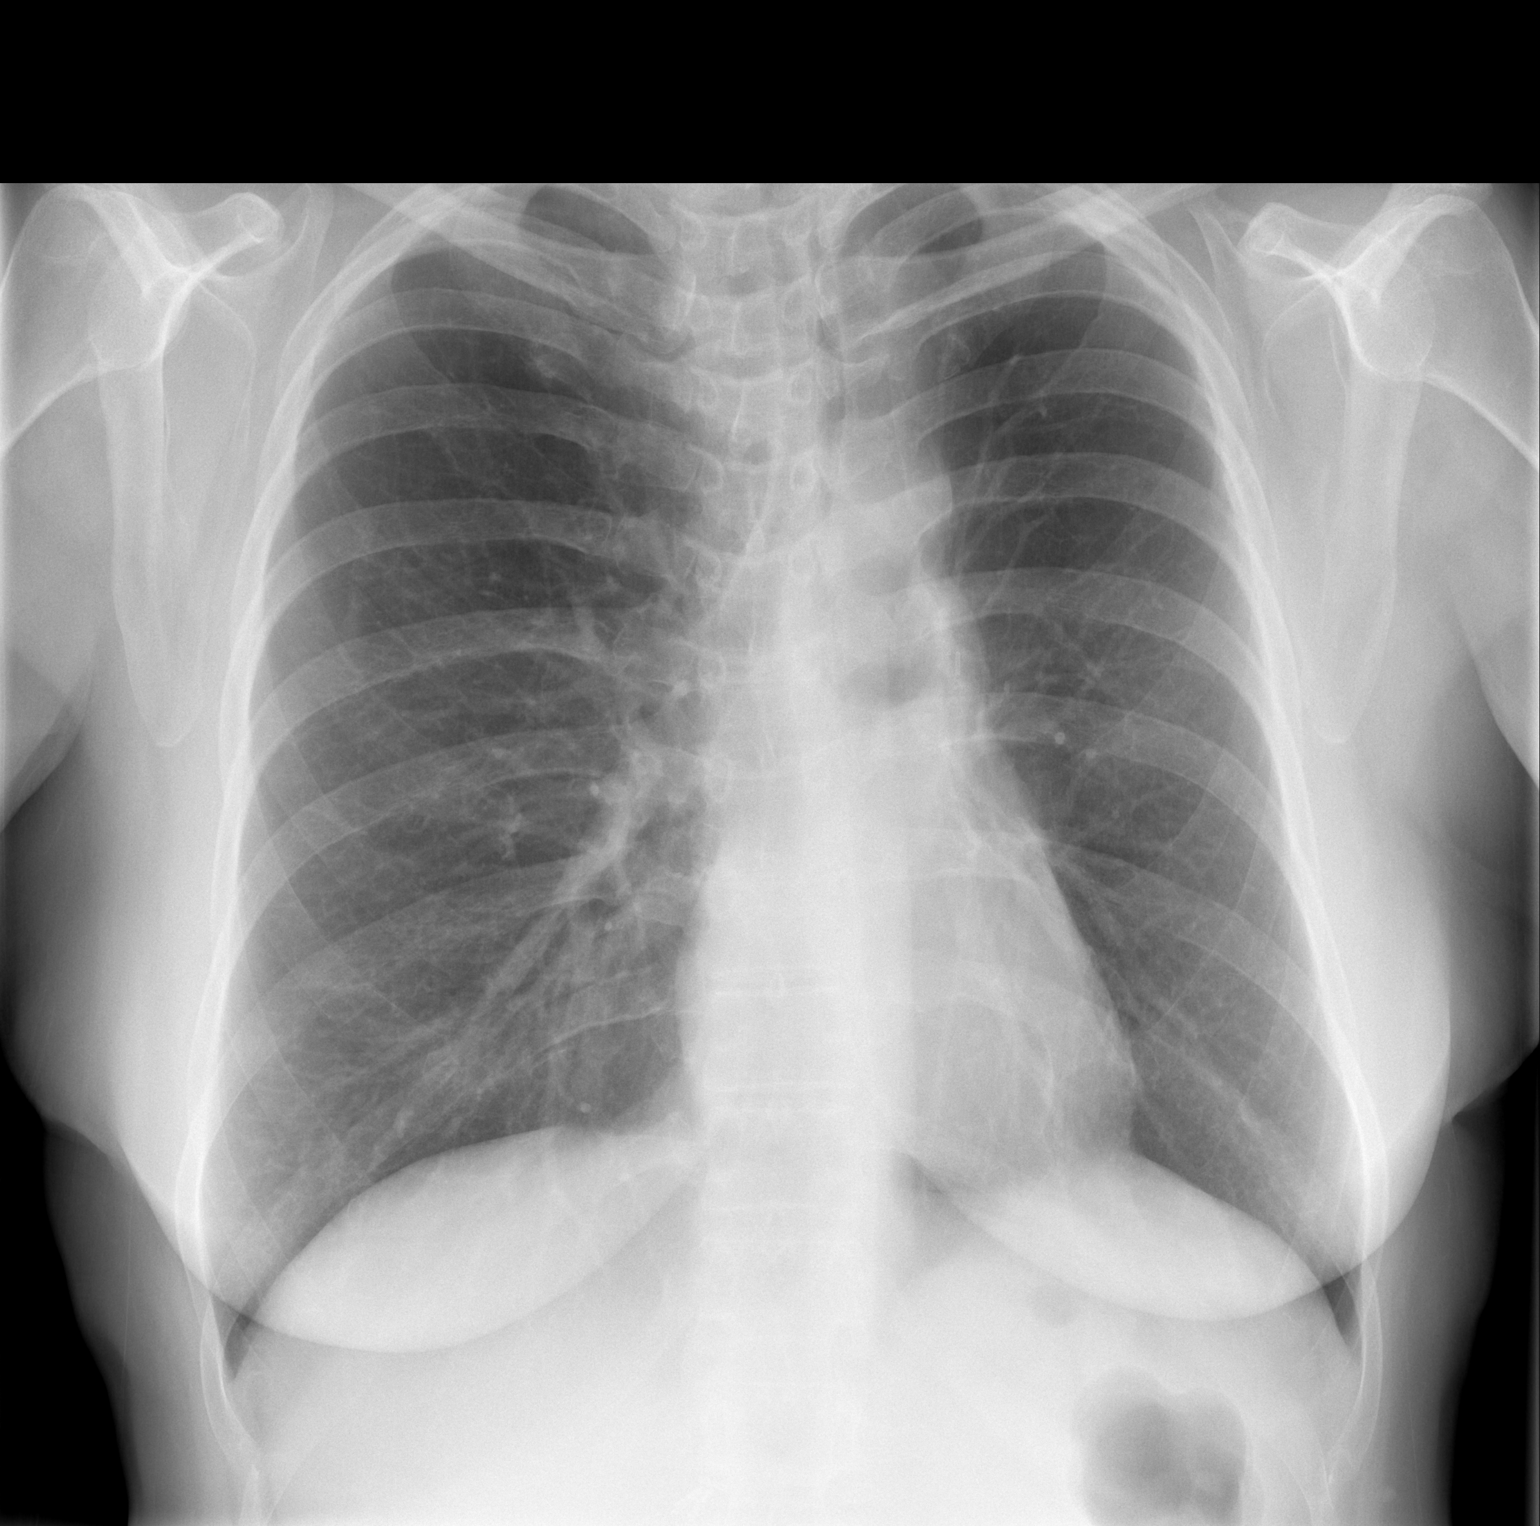

[w chest lat]
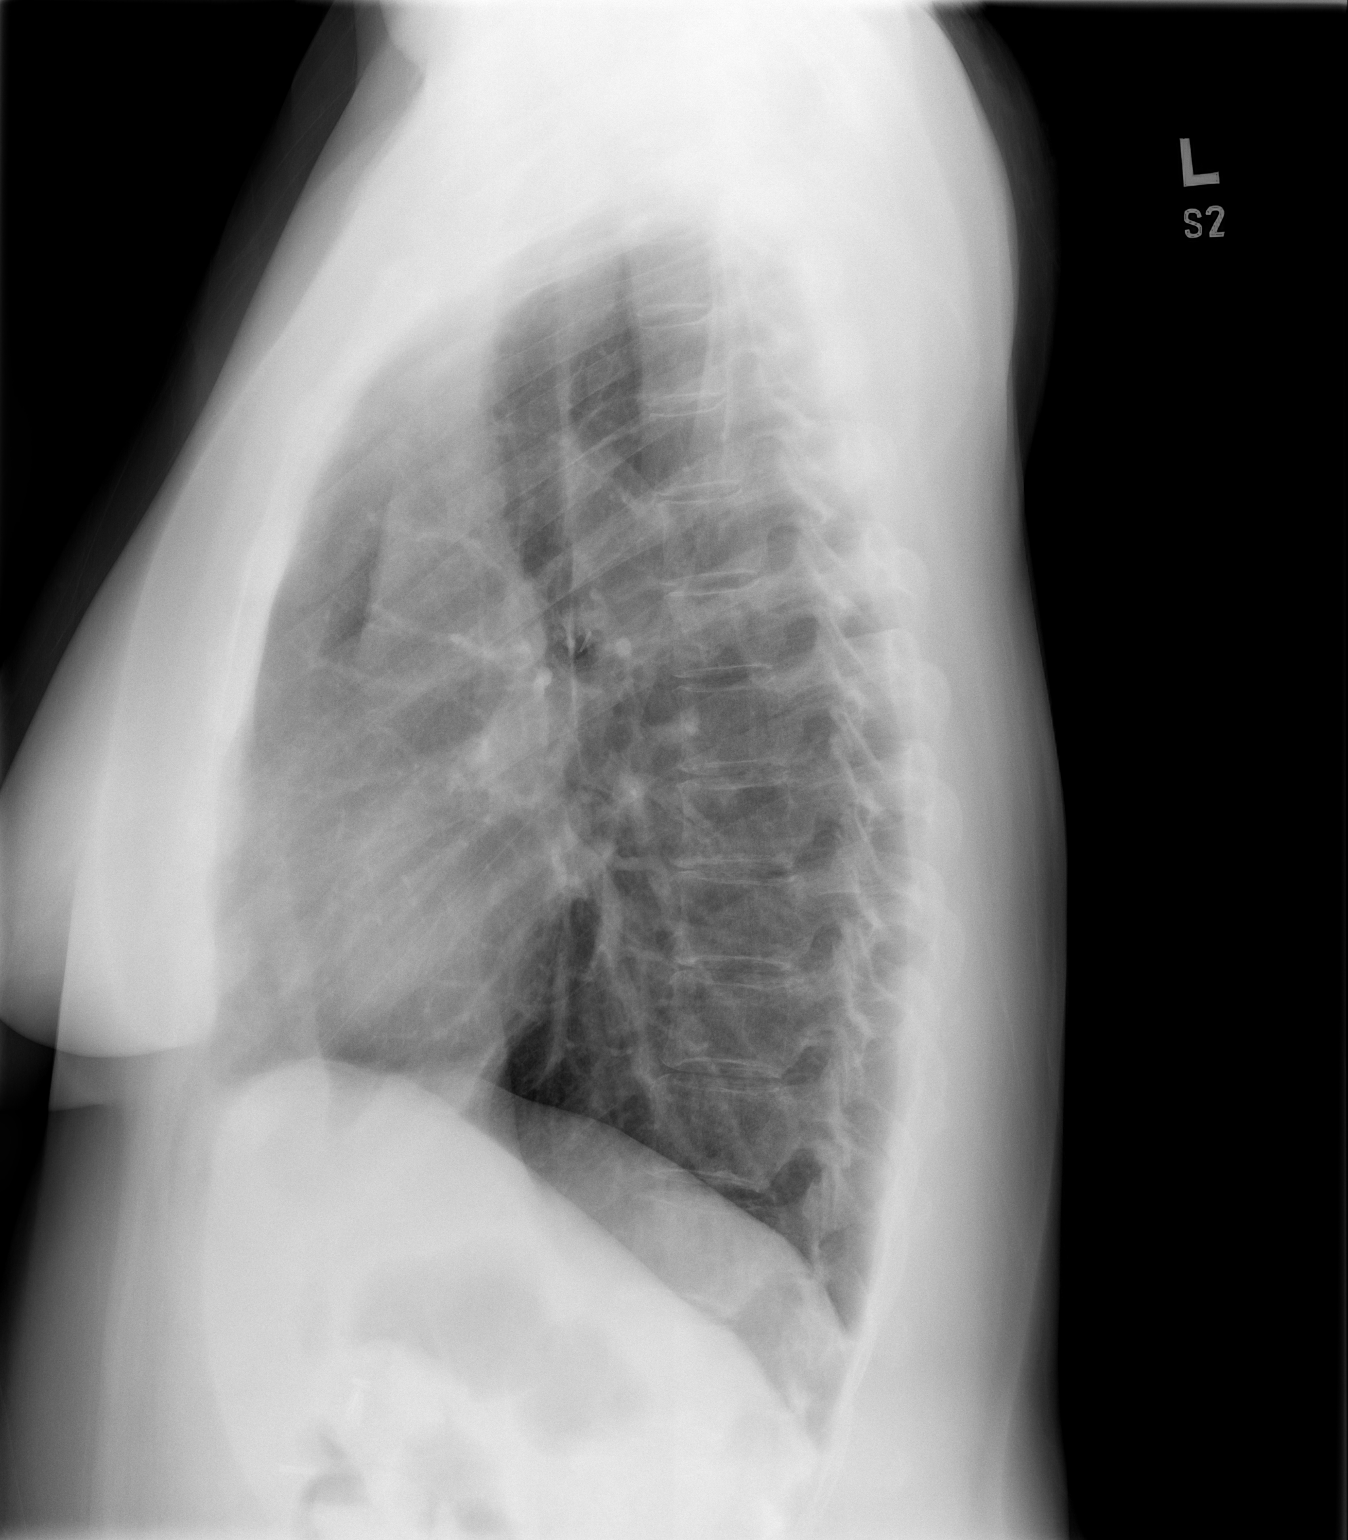

[2 of 2 positions shown; findings below may reference images not displayed]

FINDINGS: Haziness in the medial left upper hemithorax is secondary to prior
left upper lobectomy. No active infiltrate or effusion is seen.
Mediastinal and hilar contours are unremarkable. The heart is within
normal limits in size. No bony abnormality is seen.
IMPRESSION: Stable postop change in the left upper hemithorax. No active lung
disease.

## 2017-11-01 DIAGNOSIS — M47816 Spondylosis without myelopathy or radiculopathy, lumbar region: Secondary | ICD-10-CM | POA: Diagnosis not present

## 2017-11-01 DIAGNOSIS — G894 Chronic pain syndrome: Secondary | ICD-10-CM | POA: Diagnosis not present

## 2017-11-01 DIAGNOSIS — M961 Postlaminectomy syndrome, not elsewhere classified: Secondary | ICD-10-CM | POA: Diagnosis not present

## 2017-11-01 DIAGNOSIS — M47812 Spondylosis without myelopathy or radiculopathy, cervical region: Secondary | ICD-10-CM | POA: Diagnosis not present

## 2017-11-02 ENCOUNTER — Ambulatory Visit (INDEPENDENT_AMBULATORY_CARE_PROVIDER_SITE_OTHER): Payer: Medicare Other | Admitting: Pulmonary Disease

## 2017-11-02 ENCOUNTER — Encounter: Payer: Self-pay | Admitting: Pulmonary Disease

## 2017-11-02 VITALS — BP 112/76 | HR 106 | Ht 67.0 in | Wt 179.4 lb

## 2017-11-02 DIAGNOSIS — H17812 Minor opacity of cornea, left eye: Secondary | ICD-10-CM | POA: Diagnosis not present

## 2017-11-02 DIAGNOSIS — J449 Chronic obstructive pulmonary disease, unspecified: Secondary | ICD-10-CM

## 2017-11-02 DIAGNOSIS — H18823 Corneal disorder due to contact lens, bilateral: Secondary | ICD-10-CM | POA: Diagnosis not present

## 2017-11-02 DIAGNOSIS — J301 Allergic rhinitis due to pollen: Secondary | ICD-10-CM

## 2017-11-02 DIAGNOSIS — C3412 Malignant neoplasm of upper lobe, left bronchus or lung: Secondary | ICD-10-CM | POA: Diagnosis not present

## 2017-11-02 DIAGNOSIS — H16143 Punctate keratitis, bilateral: Secondary | ICD-10-CM | POA: Diagnosis not present

## 2017-11-02 DIAGNOSIS — H524 Presbyopia: Secondary | ICD-10-CM | POA: Diagnosis not present

## 2017-11-02 DIAGNOSIS — H5213 Myopia, bilateral: Secondary | ICD-10-CM | POA: Diagnosis not present

## 2017-11-02 NOTE — Patient Instructions (Signed)
COPD: You don't need to take a controller medicine because you have been doing well Take the combivent every 4-6 hours as needed for chest tightness wheezing, or shortness of breath I am glad that you have had all of your immunizations Practice good hand hygiene this time of year  Allergic rhinitis: Use Neil Med rinses with distilled water at least twice per day using the instructions on the package. 1/2 hour after using the [facility] Med rinse, use Nasacort two puffs in each nostril once per day.  Remember that the Nasacort can take 1-2 weeks to work after regular use. Use generic zyrtec (cetirizine) every day.  If this doesn't help, then stop taking it and use chlorpheniramine-phenylephrine combination tablets.   We will see you back in 1 year or sooner if needed

## 2017-11-02 NOTE — Progress Notes (Signed)
Subjective:   PATIENT ID: [name] GENDER: female DOB: 1956/02/26, MRN: [id number]  Referred in 2017 for COPD, has a history of lung cancer  HPI  Chief Complaint  Patient presents with  . Follow-up    follow up from lung cancer surgery 10/17/16   [name] says that she still has some pain in her left chest related to her surgery.    She says that from time to time she has some dyspnea on exertion: > she has albuterol-ipratropium on hand but she hasn't had to use it > she says sometimes the weather will change and make her feel a little more dyspnea > she thinks her symptoms are mostly related to throat congestion from post nasal drip > no bronchitis, no pneumonia  Lung cancer: > no weight loss > recent CT chest looked OK > she saw Dr. Servando Snare recently and had a good report    Past Medical History:  Diagnosis Date  . ADD (attention deficit disorder)   . Carotid stenosis   . Carpal tunnel syndrome on both sides   . Cervical spondylosis without myelopathy   . COPD (chronic obstructive pulmonary disease) ([place])   . Costochondritis   . Depression   . Fibromyalgia   . GERD (gastroesophageal reflux disease)   . Heart murmur    dx 20-30 yrs ago.    . Lumbosacral spondylosis without myelopathy   . Lung cancer, left upper lobe ([place]) 09/11/2016   09/2016 PET CT > spiculated left upper lobe nodule, no mediastinal lymphadenopathy, but some CERVICAL lymphadenopathy of undetermined significance  . Migraine    has them 12-15 days a month  . Mitral valve prolapse   . MRSA (methicillin resistant staph aureus) culture positive   . MRSA (methicillin resistant Staphylococcus aureus)    was in her perirectal abscess  back in 2006  . Myalgia and myositis, unspecified   . Palpitations   . Paroxysmal supraventricular tachycardia ([place])   . Pneumonia    of last yr (2016)  . SVT (supraventricular tachycardia) ([place])    very brief runs  . Tobacco abuse         Review  of Systems  Constitutional: Negative for malaise/fatigue and weight loss.  HENT: Negative for congestion and ear discharge.   Respiratory: Negative for cough, sputum production and wheezing.   Cardiovascular: Negative for palpitations, claudication and leg swelling.  Skin: Negative for rash.      Objective:  Physical Exam   Vitals:   11/02/17 1431  BP: 112/76  Pulse: (!) 106  SpO2: 96%  Weight: 179 lb 6.4 oz (81.4 kg)  Height: 5\' 7"  (1.702 m)    Gen: well appearing HENT: OP clear, TM's clear, neck supple PULM: CTA B, normal percussion CV: RRR, no mgr, trace edema GI: BS+, soft, nontender Derm: no cyanosis or rash Psyche: normal mood and affect   CBC    Component Value Date/Time   WBC 9.6 10/19/2016 0500   RBC 4.08 10/19/2016 0500   HGB 12.8 10/19/2016 0500   HGB 13.9 05/11/2009 1156   HCT 39.2 10/19/2016 0500   HCT 41.2 05/11/2009 1156   PLT 207 10/19/2016 0500   PLT 275 05/11/2009 1156   MCV 96.1 10/19/2016 0500   MCV 99 05/11/2009 1156   MCH 31.4 10/19/2016 0500   MCHC 32.7 10/19/2016 0500   RDW 13.0 10/19/2016 0500   RDW 10.9 05/11/2009 1156   LYMPHSABS 2.9 09/21/2016 0600   LYMPHSABS 3.1 05/11/2009 1156  MONOABS 0.4 09/21/2016 0600   EOSABS 0.1 09/21/2016 0600   EOSABS 0.2 05/11/2009 1156   BASOSABS 0.1 09/21/2016 0600   BASOSABS 0.0 05/11/2009 1156   Records from her visit with cardiology in November reviewed where she was seen for coronary artery disease and atrial tachycardia.  No changes to medications were made.    Chest imaging: November 2018 CT chest: Status post left upper lobectomy, no evidence of recurrent or metastatic disease.  Images independently reviewed.  PFT: 02/2017 Spirometry: Ratio 60 FEV1 1.7L 59% pred  Labs:  Path:  Echo:  Heart Catheterization:       Assessment & Plan:   COPD GOLD II  Malignant neoplasm of upper lobe of left lung (HCC)  Allergic rhinitis due to pollen, unspecified  seasonality  Discussion: This has been a stable interval for [name].  She has not had an exacerbation of COPD since the last visit and she remains asymptomatic.  So at this point she just needs as needed bronchodilators and not controller bronchodilators.  I am pleased that her most recent visit with thoracic oncology showed no evidence of recurrence.  Plan: COPD: You don't need to take a controller medicine because you have been doing well Take the combivent every 4-6 hours as needed for chest tightness wheezing, or shortness of breath I am glad that you have had all of your immunizations Practice good hand hygiene this time of year  Allergic rhinitis: Use [facility] Med rinses with distilled water at least twice per day using the instructions on the package. 1/2 hour after using the [facility] Med rinse, use Nasacort two puffs in each nostril once per day.  Remember that the Nasacort can take 1-2 weeks to work after regular use. Use generic zyrtec (cetirizine) every day.  If this doesn't help, then stop taking it and use chlorpheniramine-phenylephrine combination tablets.   We will see you back in 1 year or sooner if needed     Current Outpatient Medications:  .  amitriptyline (ELAVIL) 50 MG tablet, Take 50 mg by mouth at bedtime., Disp: , Rfl:  .  Ascorbic Acid (VITAMIN C) 1000 MG tablet, Take 1,000 mg by mouth daily. , Disp: , Rfl:  .  aspirin EC 81 MG tablet, Take 81 mg by mouth daily., Disp: , Rfl:  .  Calcium Carbonate-Vit D-Min (CALCIUM 1200 PO), Take 1,200 mg by mouth 2 (two) times daily., Disp: , Rfl:  .  cetirizine (ZYRTEC) 10 MG tablet, Take 10 mg by mouth daily as needed for allergies (alternates between zyrtec and claritin weekly). , Disp: , Rfl:  .  COMBIVENT RESPIMAT 20-100 MCG/ACT AERS respimat, INHALE 2 PUFFS INTO THE LUNGS 4 TIMES DAILY AS NEEDED FOR WHEEZING, Disp: 3 Inhaler, Rfl: 1 .  dexlansoprazole (DEXILANT) 60 MG capsule, Take 60 mg by mouth daily., Disp: , Rfl:  .  docusate  sodium (COLACE) 100 MG capsule, Take 100 mg by mouth 2 (two) times daily as needed for constipation., Disp: , Rfl:  .  estradiol (ESTRACE) 1 MG tablet, Take 1 mg by mouth daily., Disp: , Rfl:  .  FIORICET 50-300-40 MG CAPS, 1 to 2 every 4-6 hrs as needed (Patient taking differently: Take 1-2 tablets by mouth every 4 (four) hours as needed (migraines). ), Disp: 60 capsule, Rfl: 0 .  Ginger, Zingiber officinalis, (GINGER ROOT) 550 MG CAPS, Take 550 mg by mouth 2 (two) times daily as needed (nausea). , Disp: , Rfl:  .  loratadine (CLARITIN) 10 MG tablet, Take  10 mg by mouth daily as needed for allergies (Alternates between zyrtec and claritin weekly). , Disp: , Rfl:  .  medroxyPROGESTERone (PROVERA) 5 MG tablet, Take 5 mg by mouth daily. , Disp: , Rfl:  .  methocarbamol (ROBAXIN) 750 MG tablet, Take 750 mg by mouth every 6 (six) hours as needed for muscle spasms., Disp: , Rfl:  .  Multiple Vitamin (MULTIVITAMIN WITH MINERALS) TABS, Take 1 tablet by mouth daily., Disp: , Rfl:  .  naloxegol oxalate (MOVANTIK) 25 MG TABS tablet, Take 25 mg by mouth daily., Disp: , Rfl:  .  oxyCODONE-acetaminophen (PERCOCET) 7.5-325 MG per tablet, Take 1 tablet by mouth every 4 (four) hours as needed for moderate pain. , Disp: , Rfl:  .  pregabalin (LYRICA) 150 MG capsule, Take 150 mg by mouth 2 (two) times daily., Disp: , Rfl:  .  pyridoxine (B-6) 100 MG tablet, Take 100 mg by mouth 2 (two) times daily., Disp: , Rfl:  .  ranitidine (ZANTAC) 150 MG tablet, Take 150 mg by mouth 2 (two) times daily as needed. If dexilant doesn't work, Disp: , Rfl:  .  triamcinolone ointment (KENALOG) 0.1 %, Apply 1 application topically daily as needed. , Disp: , Rfl:  .  TURMERIC PO, Take 550 mg by mouth 3 (three) times daily. , Disp: , Rfl:  .  venlafaxine (EFFEXOR) 75 MG tablet, Take 150 mg 2 (two) times daily by mouth. , Disp: , Rfl:

## 2017-11-05 ENCOUNTER — Telehealth: Payer: Self-pay | Admitting: Pulmonary Disease

## 2017-11-05 MED ORDER — PREDNISONE 20 MG PO TABS: 20.0000 mg | ORAL_TABLET | Freq: Every day | ORAL | 0 refills | Status: DC

## 2017-11-05 NOTE — Telephone Encounter (Signed)
Prednisone 20mg daily x 5 days  Call if no improvement 

## 2017-11-05 NOTE — Telephone Encounter (Signed)
Called and spoke to [name]. [name] c/o prod cough with little mucus production - [name] unsure of color, sinus congestion, sweats (without fever) x 3 days. [name] was last seen on 11/02/2017 by BQ and was feeling fine, [name] states she began having s/s that evening after ROV. Denies SOB and CP/tightness. [name] is taking Mucinex DM with some relief. [name] was unable to produce mucus without taking Mucinex, [name] now able to expectorate but unsure the color of mucus. [name] is requesting a message to be sent to BQ to see if he recommends anything.   Dr. Lake Bells please advise. Thanks.

## 2017-11-05 NOTE — Telephone Encounter (Signed)
Called pt and advised message from the provider. Pt understood and verbalized understanding. Nothing further is needed.   Rx sent into the pharmacy.

## 2017-11-12 DIAGNOSIS — R05 Cough: Secondary | ICD-10-CM | POA: Diagnosis not present

## 2017-11-12 DIAGNOSIS — J019 Acute sinusitis, unspecified: Secondary | ICD-10-CM | POA: Diagnosis not present

## 2017-11-29 ENCOUNTER — Ambulatory Visit: Payer: Medicare Other | Admitting: Psychiatry

## 2017-11-29 DIAGNOSIS — F900 Attention-deficit hyperactivity disorder, predominantly inattentive type: Secondary | ICD-10-CM

## 2017-11-29 DIAGNOSIS — M47812 Spondylosis without myelopathy or radiculopathy, cervical region: Secondary | ICD-10-CM | POA: Diagnosis not present

## 2017-11-29 DIAGNOSIS — F0632 Mood disorder due to known physiological condition with major depressive-like episode: Secondary | ICD-10-CM | POA: Diagnosis not present

## 2017-11-29 DIAGNOSIS — M47816 Spondylosis without myelopathy or radiculopathy, lumbar region: Secondary | ICD-10-CM | POA: Diagnosis not present

## 2017-11-29 DIAGNOSIS — G894 Chronic pain syndrome: Secondary | ICD-10-CM | POA: Diagnosis not present

## 2017-11-29 DIAGNOSIS — M961 Postlaminectomy syndrome, not elsewhere classified: Secondary | ICD-10-CM | POA: Diagnosis not present

## 2017-12-27 ENCOUNTER — Ambulatory Visit: Payer: Medicare Other | Admitting: Psychiatry

## 2017-12-27 DIAGNOSIS — F900 Attention-deficit hyperactivity disorder, predominantly inattentive type: Secondary | ICD-10-CM

## 2017-12-27 DIAGNOSIS — M961 Postlaminectomy syndrome, not elsewhere classified: Secondary | ICD-10-CM | POA: Diagnosis not present

## 2017-12-27 DIAGNOSIS — F0632 Mood disorder due to known physiological condition with major depressive-like episode: Secondary | ICD-10-CM

## 2017-12-27 DIAGNOSIS — M47812 Spondylosis without myelopathy or radiculopathy, cervical region: Secondary | ICD-10-CM | POA: Diagnosis not present

## 2017-12-27 DIAGNOSIS — M47816 Spondylosis without myelopathy or radiculopathy, lumbar region: Secondary | ICD-10-CM | POA: Diagnosis not present

## 2017-12-27 DIAGNOSIS — G894 Chronic pain syndrome: Secondary | ICD-10-CM | POA: Diagnosis not present

## 2017-12-28 ENCOUNTER — Telehealth: Payer: Self-pay | Admitting: Cardiovascular Disease

## 2017-12-28 NOTE — Telephone Encounter (Signed)
Pt called to FYI heart rate 116 1:10p for 2 days not it has been high.   Please give her a call on what to do.

## 2017-12-28 NOTE — Telephone Encounter (Signed)
It is not surprising her heart rate is faster with emotional stress and tooth pain.  If she does not have shortness of breath, dizziness, chest pain, I would just wait it out for the weekend and call us back on Monday. If it is still fast then, will get an ECG, maybe a Holter monitor. If she has the above symptoms (shortness of breath, dizziness, chest pain), she should be evaluated - it would have to be in Urgent Care or ED over the weekend. MCr

## 2017-12-28 NOTE — Telephone Encounter (Signed)
Returned call to patient of Dr. Sallyanne Kuster. She has h/o PAT. She is under a lot of stress. She reports her HR was 179bpm and then decreased to 123bpm at her pain MD office yesterday. Her pain MD checked her HR and told her it was "all over the place." She reports no changes to her medications. She had a tooth pulled Wednesday AM for fracture with local anesthesia - she has Rx for amoxicillin, but has not started. Her HR a few minutes ago was 127bpm, decreased to 103 at rest @ 1:29. She has shortness of breath but had part of her lung removed in 2017. She states Dr. Rex Kras told her to "try to ignore" her symptoms of fast HR  Will route to Dr. Loletha Grayer for recommendations

## 2017-12-28 NOTE — Telephone Encounter (Signed)
Patient called w/MD recommendations. She voiced understanding. She states she has chronic SOB from lung cancer and chest discomfort from costochondritis - advised if her symptoms are worse that her "baseline".

## 2017-12-31 ENCOUNTER — Telehealth: Payer: Self-pay | Admitting: Cardiovascular Disease

## 2017-12-31 NOTE — Telephone Encounter (Signed)
All looks OK. I do not think we need to repeat a monitor. Better ways to get kicks and giggles than that [employer]

## 2017-12-31 NOTE — Telephone Encounter (Signed)
Patient made aware of Dr. Victorino December recommendation and verbalized her understanding.

## 2017-12-31 NOTE — Telephone Encounter (Signed)
Returned the call to the patient. She was calling to give an update on her heart rate for Dr. Sallyanne Kuster.  1/25 133/82 HR 86 1/26 127/85 HR 94 1/28 120/78 HR 90  She stated that she would wear a monitor for "kicks and giggles" if that is what Dr. Sallyanne Kuster recommends. Otherwise, the patient stated that she feels much better. Message routed.

## 2017-12-31 NOTE — Telephone Encounter (Signed)
New message     Patient calling to report HR.Please call   STAT if HR is under 50 or over 120 (normal HR is 60-100 beats per minute)  1) What is your heart rate? Had not check HR today  2) Do you have a log of your heart rate readings (document readings)? 86, 94, 102  3) Do you have any other symptoms? No

## 2018-01-10 DIAGNOSIS — G8929 Other chronic pain: Secondary | ICD-10-CM | POA: Diagnosis not present

## 2018-01-10 DIAGNOSIS — K219 Gastro-esophageal reflux disease without esophagitis: Secondary | ICD-10-CM | POA: Diagnosis not present

## 2018-01-10 DIAGNOSIS — M797 Fibromyalgia: Secondary | ICD-10-CM | POA: Diagnosis not present

## 2018-01-10 DIAGNOSIS — J449 Chronic obstructive pulmonary disease, unspecified: Secondary | ICD-10-CM | POA: Diagnosis not present

## 2018-01-28 DIAGNOSIS — M47816 Spondylosis without myelopathy or radiculopathy, lumbar region: Secondary | ICD-10-CM | POA: Diagnosis not present

## 2018-01-28 DIAGNOSIS — M47812 Spondylosis without myelopathy or radiculopathy, cervical region: Secondary | ICD-10-CM | POA: Diagnosis not present

## 2018-01-28 DIAGNOSIS — M961 Postlaminectomy syndrome, not elsewhere classified: Secondary | ICD-10-CM | POA: Diagnosis not present

## 2018-01-28 DIAGNOSIS — G894 Chronic pain syndrome: Secondary | ICD-10-CM | POA: Diagnosis not present

## 2018-01-30 ENCOUNTER — Ambulatory Visit (INDEPENDENT_AMBULATORY_CARE_PROVIDER_SITE_OTHER): Payer: Medicare Other | Admitting: Psychiatry

## 2018-01-30 DIAGNOSIS — F900 Attention-deficit hyperactivity disorder, predominantly inattentive type: Secondary | ICD-10-CM

## 2018-01-30 DIAGNOSIS — F0632 Mood disorder due to known physiological condition with major depressive-like episode: Secondary | ICD-10-CM

## 2018-02-01 DIAGNOSIS — H18823 Corneal disorder due to contact lens, bilateral: Secondary | ICD-10-CM | POA: Diagnosis not present

## 2018-02-01 DIAGNOSIS — H43811 Vitreous degeneration, right eye: Secondary | ICD-10-CM | POA: Diagnosis not present

## 2018-02-25 DIAGNOSIS — G894 Chronic pain syndrome: Secondary | ICD-10-CM | POA: Diagnosis not present

## 2018-02-25 DIAGNOSIS — M47812 Spondylosis without myelopathy or radiculopathy, cervical region: Secondary | ICD-10-CM | POA: Diagnosis not present

## 2018-02-25 DIAGNOSIS — M47816 Spondylosis without myelopathy or radiculopathy, lumbar region: Secondary | ICD-10-CM | POA: Diagnosis not present

## 2018-02-25 DIAGNOSIS — M961 Postlaminectomy syndrome, not elsewhere classified: Secondary | ICD-10-CM | POA: Diagnosis not present

## 2018-02-27 ENCOUNTER — Ambulatory Visit: Payer: Medicare Other | Admitting: Psychiatry

## 2018-02-27 DIAGNOSIS — F900 Attention-deficit hyperactivity disorder, predominantly inattentive type: Secondary | ICD-10-CM | POA: Diagnosis not present

## 2018-02-27 DIAGNOSIS — F0632 Mood disorder due to known physiological condition with major depressive-like episode: Secondary | ICD-10-CM | POA: Diagnosis not present

## 2018-02-28 DIAGNOSIS — Z124 Encounter for screening for malignant neoplasm of cervix: Secondary | ICD-10-CM | POA: Diagnosis not present

## 2018-02-28 DIAGNOSIS — Z01419 Encounter for gynecological examination (general) (routine) without abnormal findings: Secondary | ICD-10-CM | POA: Diagnosis not present

## 2018-03-25 DIAGNOSIS — G894 Chronic pain syndrome: Secondary | ICD-10-CM | POA: Diagnosis not present

## 2018-03-25 DIAGNOSIS — M47816 Spondylosis without myelopathy or radiculopathy, lumbar region: Secondary | ICD-10-CM | POA: Diagnosis not present

## 2018-03-25 DIAGNOSIS — M961 Postlaminectomy syndrome, not elsewhere classified: Secondary | ICD-10-CM | POA: Diagnosis not present

## 2018-03-25 DIAGNOSIS — M47812 Spondylosis without myelopathy or radiculopathy, cervical region: Secondary | ICD-10-CM | POA: Diagnosis not present

## 2018-03-26 ENCOUNTER — Other Ambulatory Visit: Payer: Self-pay | Admitting: Cardiothoracic Surgery

## 2018-03-26 DIAGNOSIS — C3490 Malignant neoplasm of unspecified part of unspecified bronchus or lung: Secondary | ICD-10-CM

## 2018-04-04 DIAGNOSIS — Z5181 Encounter for therapeutic drug level monitoring: Secondary | ICD-10-CM | POA: Diagnosis not present

## 2018-04-04 DIAGNOSIS — R5383 Other fatigue: Secondary | ICD-10-CM | POA: Diagnosis not present

## 2018-04-08 DIAGNOSIS — Z5181 Encounter for therapeutic drug level monitoring: Secondary | ICD-10-CM | POA: Diagnosis not present

## 2018-04-18 ENCOUNTER — Ambulatory Visit
Admission: RE | Admit: 2018-04-18 | Discharge: 2018-04-18 | Disposition: A | Discharge status: Home / Self Care | Payer: Medicare Other | Source: Ambulatory Visit | Attending: Cardiothoracic Surgery | Admitting: Cardiothoracic Surgery

## 2018-04-18 ENCOUNTER — Ambulatory Visit: Payer: Medicare Other | Admitting: Cardiothoracic Surgery

## 2018-04-18 ENCOUNTER — Encounter: Payer: Self-pay | Admitting: Cardiothoracic Surgery

## 2018-04-18 VITALS — BP 114/84 | HR 96 | Resp 20 | Ht 67.0 in | Wt 174.0 lb

## 2018-04-18 DIAGNOSIS — C3492 Malignant neoplasm of unspecified part of left bronchus or lung: Secondary | ICD-10-CM | POA: Diagnosis not present

## 2018-04-18 DIAGNOSIS — C3490 Malignant neoplasm of unspecified part of unspecified bronchus or lung: Secondary | ICD-10-CM

## 2018-04-18 DIAGNOSIS — Z85118 Personal history of other malignant neoplasm of bronchus and lung: Secondary | ICD-10-CM

## 2018-04-18 DIAGNOSIS — Z902 Acquired absence of lung [part of]: Secondary | ICD-10-CM

## 2018-04-18 DIAGNOSIS — M47812 Spondylosis without myelopathy or radiculopathy, cervical region: Secondary | ICD-10-CM | POA: Diagnosis not present

## 2018-04-18 DIAGNOSIS — G894 Chronic pain syndrome: Secondary | ICD-10-CM | POA: Diagnosis not present

## 2018-04-18 DIAGNOSIS — M961 Postlaminectomy syndrome, not elsewhere classified: Secondary | ICD-10-CM | POA: Diagnosis not present

## 2018-04-18 DIAGNOSIS — M47816 Spondylosis without myelopathy or radiculopathy, lumbar region: Secondary | ICD-10-CM | POA: Diagnosis not present

## 2018-04-18 NOTE — Patient Instructions (Signed)
Steps to Quit Smoking Smoking tobacco can be harmful to your health and can affect almost every organ in your body. Smoking puts you, and those around you, at risk for developing many serious chronic diseases. Quitting smoking is difficult, but it is one of the best things that you can do for your health. It is never too late to quit. What are the benefits of quitting smoking? When you quit smoking, you lower your risk of developing serious diseases and conditions, such as:  Lung cancer or lung disease, such as COPD.  Heart disease.  Stroke.  Heart attack.  Infertility.  Osteoporosis and bone fractures.  Additionally, symptoms such as coughing, wheezing, and shortness of breath may get better when you quit. You may also find that you get sick less often because your body is stronger at fighting off colds and infections. If you are pregnant, quitting smoking can help to reduce your chances of having a baby of low birth weight. How do I get ready to quit? When you decide to quit smoking, create a plan to make sure that you are successful. Before you quit:  Pick a date to quit. Set a date within the next two weeks to give you time to prepare.  Write down the reasons why you are quitting. Keep this list in places where you will see it often, such as on your bathroom mirror or in your car or wallet.  Identify the people, places, things, and activities that make you want to smoke (triggers) and avoid them. Make sure to take these actions: ? Throw away all cigarettes at home, at work, and in your car. ? Throw away smoking accessories, such as ashtrays and lighters. ? Clean your car and make sure to empty the ashtray. ? Clean your home, including curtains and carpets.  Tell your family, friends, and coworkers that you are quitting. Support from your loved ones can make quitting easier.  Talk with your health care provider about your options for quitting smoking.  Find out what treatment  options are covered by your health insurance.  What strategies can I use to quit smoking? Talk with your healthcare provider about different strategies to quit smoking. Some strategies include:  Quitting smoking altogether instead of gradually lessening how much you smoke over a period of time. Research shows that quitting "cold turkey" is more successful than gradually quitting.  Attending in-person counseling to help you build problem-solving skills. You are more likely to have success in quitting if you attend several counseling sessions. Even short sessions of 10 minutes can be effective.  Finding resources and support systems that can help you to quit smoking and remain smoke-free after you quit. These resources are most helpful when you use them often. They can include: ? Online chats with a counselor. ? Telephone quitlines. ? Printed self-help materials. ? Support groups or group counseling. ? Text messaging programs. ? Mobile phone applications.  Taking medicines to help you quit smoking. (If you are pregnant or breastfeeding, talk with your health care provider first.) Some medicines contain nicotine and some do not. Both types of medicines help with cravings, but the medicines that include nicotine help to relieve withdrawal symptoms. Your health care provider may recommend: ? Nicotine patches, gum, or lozenges. ? Nicotine inhalers or sprays. ? Non-nicotine medicine that is taken by mouth.  Talk with your health care provider about combining strategies, such as taking medicines while you are also receiving in-person counseling. Using these two strategies together   makes you more likely to succeed in quitting than if you used either strategy on its own. If you are pregnant or breastfeeding, talk with your health care provider about finding counseling or other support strategies to quit smoking. Do not take medicine to help you quit smoking unless told to do so by your health care  provider. What things can I do to make it easier to quit? Quitting smoking might feel overwhelming at first, but there is a lot that you can do to make it easier. Take these important actions:  Reach out to your family and friends and ask that they support and encourage you during this time. Call telephone quitlines, reach out to support groups, or work with a counselor for support.  Ask people who smoke to avoid smoking around you.  Avoid places that trigger you to smoke, such as bars, parties, or smoke-break areas at work.  Spend time around people who do not smoke.  Lessen stress in your life, because stress can be a smoking trigger for some people. To lessen stress, try: ? Exercising regularly. ? Deep-breathing exercises. ? Yoga. ? Meditating. ? Performing a body scan. This involves closing your eyes, scanning your body from head to toe, and noticing which parts of your body are particularly tense. Purposefully relax the muscles in those areas.  Download or purchase mobile phone or tablet apps (applications) that can help you stick to your quit plan by providing reminders, tips, and encouragement. There are many free apps, such as QuitGuide from the CDC (Centers for Disease Control and Prevention). You can find other support for quitting smoking (smoking cessation) through smokefree.gov and other websites.  How will I feel when I quit smoking? Within the first 24 hours of quitting smoking, you may start to feel some withdrawal symptoms. These symptoms are usually most noticeable 2-3 days after quitting, but they usually do not last beyond 2-3 weeks. Changes or symptoms that you might experience include:  Mood swings.  Restlessness, anxiety, or irritation.  Difficulty concentrating.  Dizziness.  Strong cravings for sugary foods in addition to nicotine.  Mild weight gain.  Constipation.  Nausea.  Coughing or a sore throat.  Changes in how your medicines work in your  body.  A depressed mood.  Difficulty sleeping (insomnia).  After the first 2-3 weeks of quitting, you may start to notice more positive results, such as:  Improved sense of smell and taste.  Decreased coughing and sore throat.  Slower heart rate.  Lower blood pressure.  Clearer skin.  The ability to breathe more easily.  Fewer sick days.  Quitting smoking is very challenging for most people. Do not get discouraged if you are not successful the first time. Some people need to make many attempts to quit before they achieve long-term success. Do your best to stick to your quit plan, and talk with your health care provider if you have any questions or concerns. This information is not intended to replace advice given to you by your health care provider. Make sure you discuss any questions you have with your health care provider. Document Released: 11/14/2001 Document Revised: 07/18/2016 Document Reviewed: 04/06/2015 Elsevier Interactive Patient Education  2018 [employer]  

## 2018-04-18 NOTE — Progress Notes (Signed)
[address]       [name],[name] [address]             [phone]                  [name]  Medical Record #[id number] Date of Birth: 03-Sep-1956  Referring VZ:DGLOVFI, [name] Spies, MD Primary Cardiology: Primary Care:Pharr, Thayer Jew, MD  Chief Complaint:  Follow Up Visit 10/17/2016 OPERATIVE REPORT PREOPERATIVE DIAGNOSIS: Left upper lobe lung mass. POSTOPERATIVE DIAGNOSIS: Adenocarcinoma of the left upper lobe. PROCEDURE PERFORMED: Video bronchoscopy, left video-assisted thoracoscopy, mini thoracotomy, wedge resection of left upper lobe, completion of left upper lobectomy with lymph node dissection and placement of On-Q device, and lymph node dissection. SURGEON: [name], M.D.   Cancer Staging Lung cancer, left upper lobe ([place]) Staging form: Lung, AJCC 7th Edition - Pathologic stage from 10/17/2016: Stage IA (T1b, N0, cM0) - Signed by [name] Isaac, MD on 10/18/2016   History of Present Illness:      Patient returns to the office today in follow-up after left upper lobectomy for stage Ia adenocarcinoma of the lung done 18 months ago.  Patient has no new complaints.  Unfortunately she has started back smoking.       Zubrod Score: At the time of surgery this patient's most appropriate activity status/level should be described as: [x]     0    Normal activity, no symptoms []     1    Restricted in physical strenuous activity but ambulatory, able to do out light work []     2    Ambulatory and capable of self care, unable to do work activities, up and about                 >50 % of waking hours                                                                                   []     3    Only limited self care, in bed greater than 50% of waking hours []     4    Completely disabled, no self care, confined to bed or chair []     5    Moribund  Social History   Tobacco Use  Smoking Status Former Smoker  . Packs/day:  0.00  . Years: 30.00  . Pack years: 0.00  . Last attempt to quit: 09/03/2016  . Years since quitting: 1.6  Smokeless Tobacco Never Used  Tobacco Comment   ONLY USING VAPE        Allergies  Allergen Reactions  . Carafate [Sucralfate] Hives and Other (See Comments)    CHEST PAIN    . Cefaclor Hives and Other (See Comments)    CHEST PAIN    . Depakote [Divalproex Sodium] Hives and Other (See Comments)    CHEST PAIN    . Lamictal [Lamotrigine] Other (See Comments)    Chest pain.  . Nitrofurantoin Hives    CHEST PAIN   . Sulfamethoxazole Hives    CHEST PAIN    . Wellbutrin [Bupropion] Hives and Other (See Comments)    CHEST  PAIN   . Abilify [Aripiprazole] Nausea Only    Current Outpatient Medications  Medication Sig Dispense Refill  . amitriptyline (ELAVIL) 50 MG tablet Take 50 mg by mouth at bedtime.    . Ascorbic Acid (VITAMIN C) 1000 MG tablet Take 1,000 mg by mouth daily.     Marland Kitchen aspirin EC 81 MG tablet Take 81 mg by mouth daily.    . Calcium Carbonate-Vit D-Min (CALCIUM 1200 PO) Take 1,200 mg by mouth 2 (two) times daily.    . cetirizine (ZYRTEC) 10 MG tablet Take 10 mg by mouth daily as needed for allergies (alternates between zyrtec and claritin weekly).     . COMBIVENT RESPIMAT 20-100 MCG/ACT AERS respimat INHALE 2 PUFFS INTO THE LUNGS 4 TIMES DAILY AS NEEDED FOR WHEEZING 3 Inhaler 1  . dexlansoprazole (DEXILANT) 60 MG capsule Take 60 mg by mouth daily.    Marland Kitchen docusate sodium (COLACE) 100 MG capsule Take 100 mg by mouth 2 (two) times daily as needed for constipation.    Marland Kitchen estradiol (ESTRACE) 1 MG tablet Take 1 mg by mouth daily.    Marland Kitchen FIORICET 50-300-40 MG CAPS 1 to 2 every 4-6 hrs as needed (Patient taking differently: Take 1-2 tablets by mouth every 4 (four) hours as needed (migraines). ) 60 capsule 0  . Ginger, Zingiber officinalis, (GINGER ROOT) 550 MG CAPS Take 550 mg by mouth 2 (two) times daily as needed (nausea).     . loratadine (CLARITIN) 10 MG tablet Take 10 mg  by mouth daily as needed for allergies (Alternates between zyrtec and claritin weekly).     . methocarbamol (ROBAXIN) 750 MG tablet Take 750 mg by mouth every 6 (six) hours as needed for muscle spasms.    . Multiple Vitamin (MULTIVITAMIN WITH MINERALS) TABS Take 1 tablet by mouth daily.    . naloxegol oxalate (MOVANTIK) 25 MG TABS tablet Take 25 mg by mouth daily.    Marland Kitchen oxyCODONE-acetaminophen (PERCOCET) 7.5-325 MG per tablet Take 1 tablet by mouth every 4 (four) hours as needed for moderate pain.     Marland Kitchen PARoxetine (PAXIL) 20 MG tablet Take 20 mg by mouth daily.    . predniSONE (DELTASONE) 20 MG tablet Take 1 tablet (20 mg total) by mouth daily with breakfast. 5 tablet 0  . pregabalin (LYRICA) 150 MG capsule Take 150 mg by mouth 2 (two) times daily.    Marland Kitchen pyridoxine (B-6) 100 MG tablet Take 100 mg by mouth 2 (two) times daily.    . ranitidine (ZANTAC) 150 MG tablet Take 150 mg by mouth 2 (two) times daily as needed. If dexilant doesn't work    . triamcinolone ointment (KENALOG) 0.1 % Apply 1 application topically daily as needed.     . TURMERIC PO Take 550 mg by mouth 3 (three) times daily.     Marland Kitchen venlafaxine (EFFEXOR) 75 MG tablet Take 150 mg 2 (two) times daily by mouth.      No current facility-administered medications for this visit.        Physical Exam: BP 114/84   Pulse 96   Resp 20   Ht 5\' 7"  (1.702 m)   Wt 174 lb (78.9 kg)   LMP 09/07/2006 (Within Years) Comment: >10 years  SpO2 95% Comment: RA  BMI 27.25 kg/m   General appearance: alert, cooperative and no distress Neurologic: intact Heart: regular rate and rhythm, S1, S2 normal, no murmur, click, rub or gallop Lungs: clear to auscultation bilaterally Abdomen: soft, non-tender; bowel sounds normal;  no masses,  no organomegaly Extremities: extremities normal, atraumatic, no cyanosis or edema and Homans sign is negative, no sign of DVT Wounds: Left chest incisions are well-healed  Diagnostic Studies & Laboratory data:          Recent Radiology Findings: Ct Chest Wo Contrast  Result Date: 04/18/2018 CLINICAL DATA:  Left upper lobectomy 10/17/2016 for lung cancer. Non-small-cell. Staging. Asymptomatic. EXAM: CT CHEST WITHOUT CONTRAST TECHNIQUE: Multidetector CT imaging of the chest was performed following the standard protocol without IV contrast. COMPARISON:  10/11/2017. FINDINGS: Cardiovascular: Aortic and branch vessel atherosclerosis. Normal heart size, without pericardial effusion. Multivessel coronary artery atherosclerosis. Mediastinum/Nodes: No supraclavicular adenopathy. No mediastinal or definite hilar adenopathy, given limitations of unenhanced CT. Lungs/Pleura: No pleural fluid. Left upper lobectomy. Mild to moderate centrilobular and mild paraseptal emphysema. Upper Abdomen: Cholecystectomy. Normal imaged portions of the liver, spleen, stomach, pancreas, adrenal glands, kidneys. Musculoskeletal: No acute osseous abnormality. IMPRESSION: 1. Status post left upper lobectomy. No evidence of recurrent or metastatic disease. 2. Aortic atherosclerosis (ICD10-I70.0) and emphysema (ICD10-J43.9). 3. Age advanced coronary artery atherosclerosis. Recommend assessment of coronary risk factors and consideration of medical therapy. Electronically Signed   By: Abigail Miyamoto M.D.   On: 04/18/2018 11:34      I have independently reviewed the above radiology findings and reviewed findings  with the patient.  Recent Labs: Lab Results  Component Value Date   WBC 9.6 10/19/2016   HGB 12.8 10/19/2016   HCT 39.2 10/19/2016   PLT 207 10/19/2016   GLUCOSE 117 (H) 10/19/2016   CHOL 182 04/09/2007   TRIG 68 04/09/2007   HDL 51 04/09/2007   LDLCALC 117 04/09/2007   ALT 15 10/19/2016   AST 24 10/19/2016   NA 138 10/19/2016   K 4.1 10/19/2016   CL 104 10/19/2016   CREATININE 0.64 10/19/2016   BUN 5 (L) 10/19/2016   CO2 29 10/19/2016   INR 1.05 10/16/2016      Assessment / Plan:      Patient without evidence of  recurrence of stage I adenocarcinoma resected from the left upper lobe 18 months ago Unfortunately the patient is back smoking associated with stress of administering this estate of her mother and stepfather. We again reviewed the need to stop smoking techniques to continue her efforts, she was given printed material in addition about smoking cessation  Plan to see her back in 6 months with a follow-up CT of the chest  I  spent 20 minutes with  the patient face to face and greater then 50% of the time was spent in counseling and coordination of care.   [name] 04/18/2018 11:44 AM

## 2018-05-09 DIAGNOSIS — Z79899 Other long term (current) drug therapy: Secondary | ICD-10-CM | POA: Diagnosis not present

## 2018-05-09 DIAGNOSIS — Z5181 Encounter for therapeutic drug level monitoring: Secondary | ICD-10-CM | POA: Diagnosis not present

## 2018-05-09 DIAGNOSIS — Z01810 Encounter for preprocedural cardiovascular examination: Secondary | ICD-10-CM | POA: Diagnosis not present

## 2018-06-20 DIAGNOSIS — M961 Postlaminectomy syndrome, not elsewhere classified: Secondary | ICD-10-CM | POA: Diagnosis not present

## 2018-06-20 DIAGNOSIS — M47816 Spondylosis without myelopathy or radiculopathy, lumbar region: Secondary | ICD-10-CM | POA: Diagnosis not present

## 2018-06-20 DIAGNOSIS — Z79891 Long term (current) use of opiate analgesic: Secondary | ICD-10-CM | POA: Diagnosis not present

## 2018-06-20 DIAGNOSIS — M47812 Spondylosis without myelopathy or radiculopathy, cervical region: Secondary | ICD-10-CM | POA: Diagnosis not present

## 2018-06-20 DIAGNOSIS — G894 Chronic pain syndrome: Secondary | ICD-10-CM | POA: Diagnosis not present

## 2018-06-30 IMAGING — CT CT CHEST W/O CM
1 series · 15 of 34 positions shown, 19 images · non-contrast
Comparison: 07/27/2016

CLINICAL DATA: Chest pain and shortness of breath off and on for
several months

EXAM:
CT CHEST WITHOUT CONTRAST
TECHNIQUE: Multidetector CT imaging of the chest was performed following the
standard protocol without IV contrast.

[Series 2: thorax · axial · 0.68mm/px · z∈[-144,+148]mm · 15 of 172 slices shown, 19 images]
[im 13/172  mediastinal]
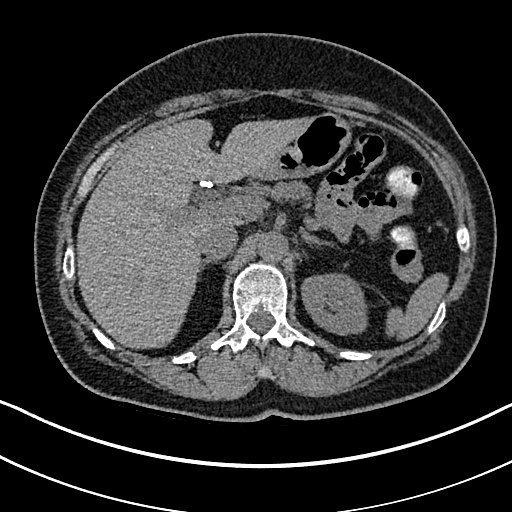
[im 13/172  lung]
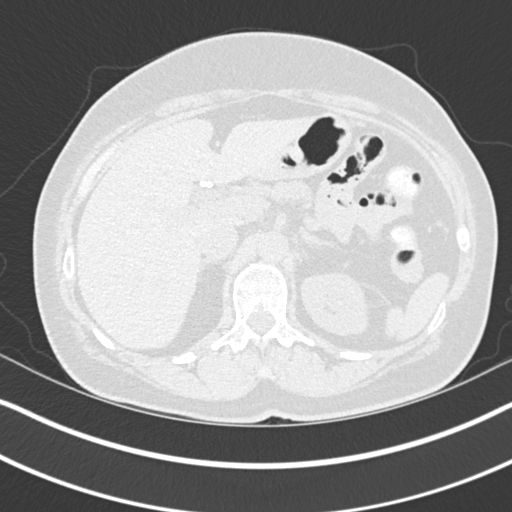
[im 26/172  lung]
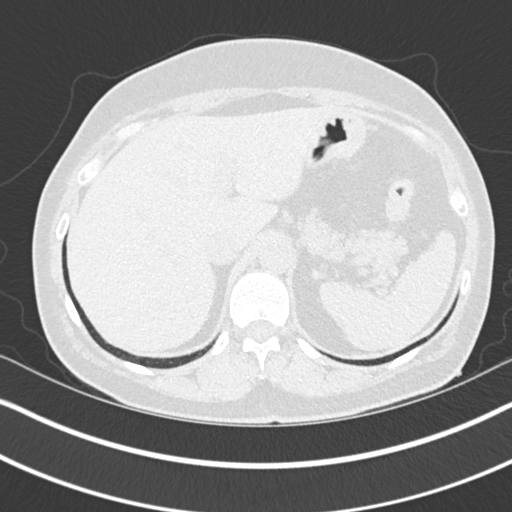
[im 35/172  lung]
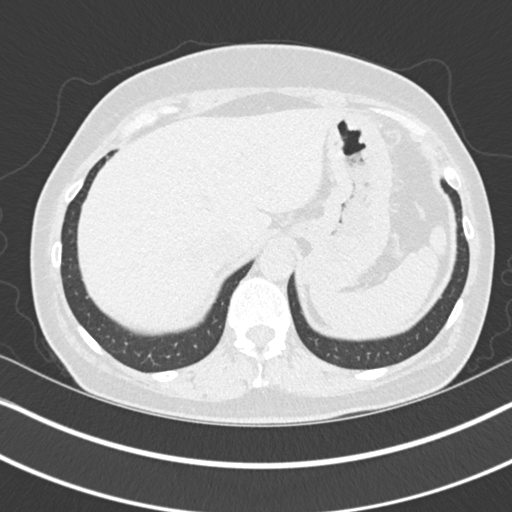
[im 45/172  lung]
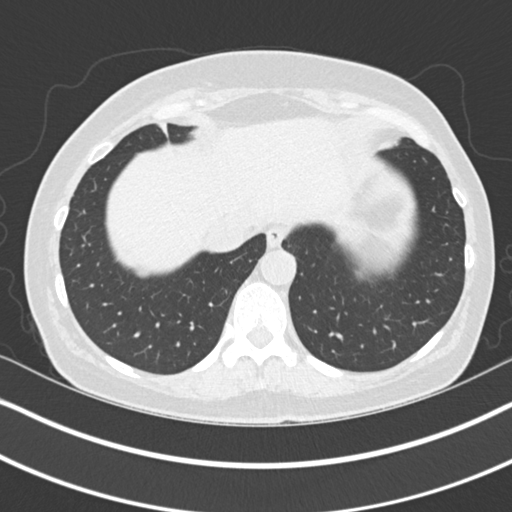
[im 58/172  mediastinal]
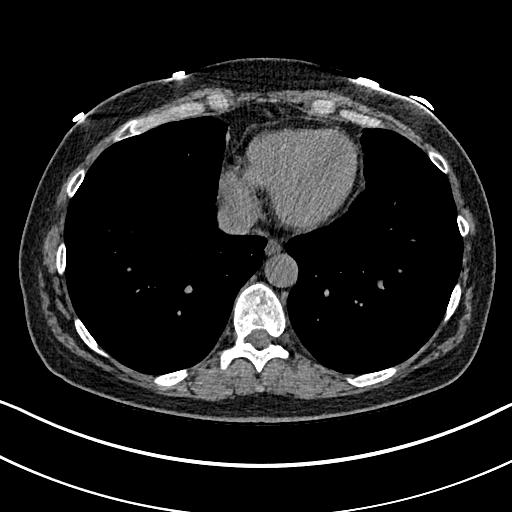
[im 58/172  lung]
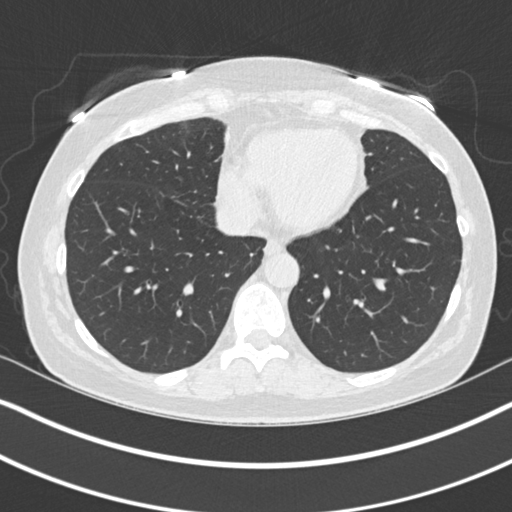
[im 69/172  lung]
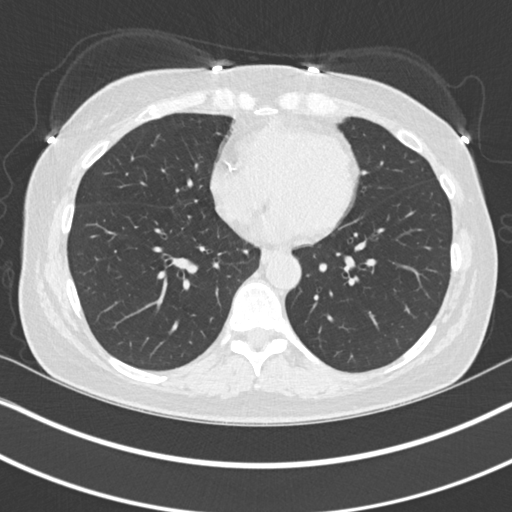
[im 77/172  lung]
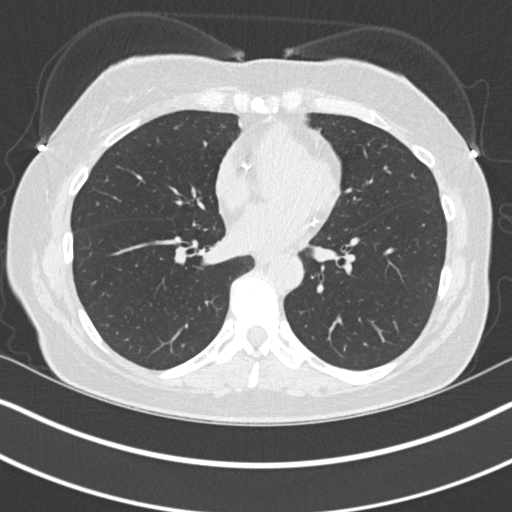
[im 89/172  lung]
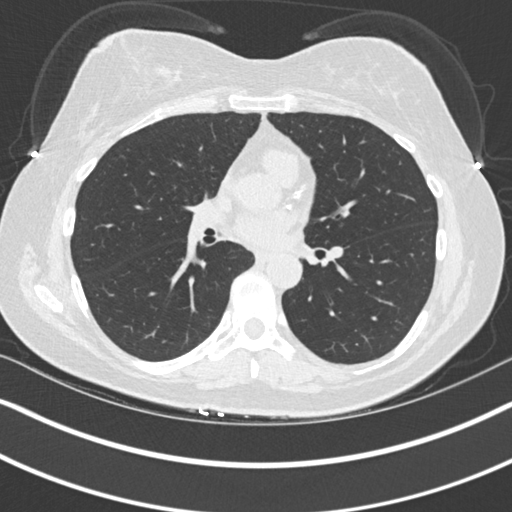
[im 96/172  mediastinal]
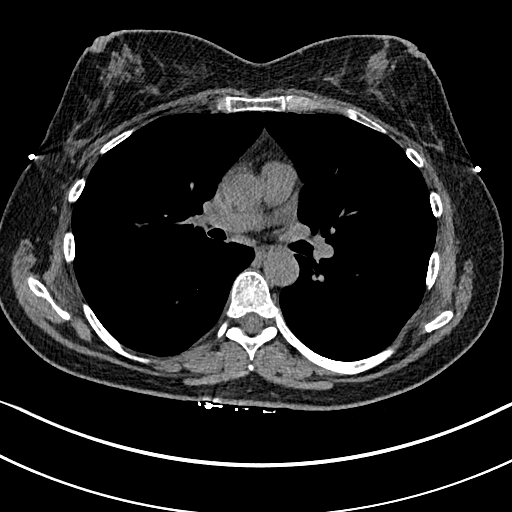
[im 96/172  lung]
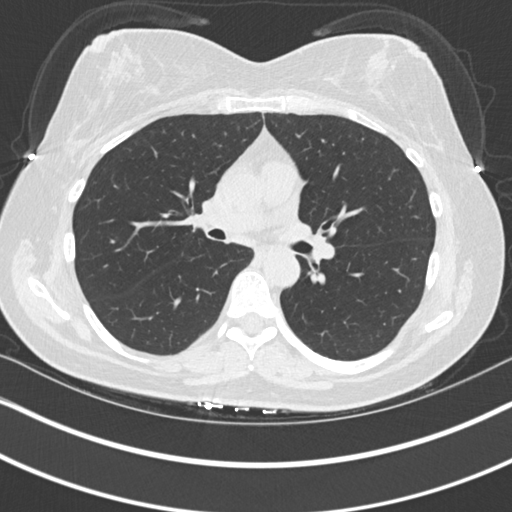
[im 103/172  lung]
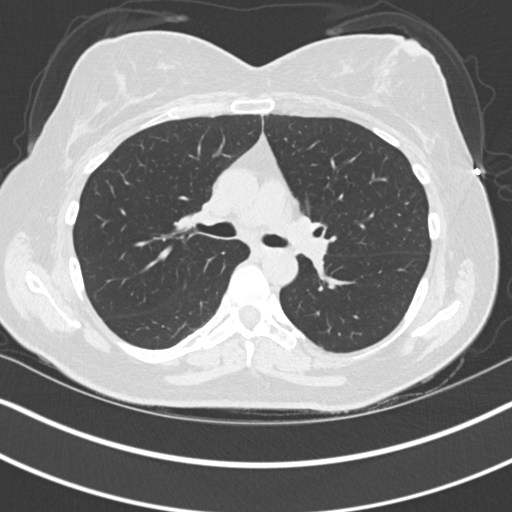
[im 115/172  lung]
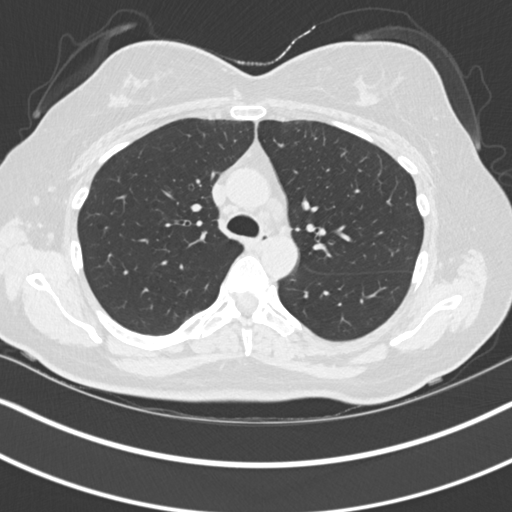
[im 127/172  lung]
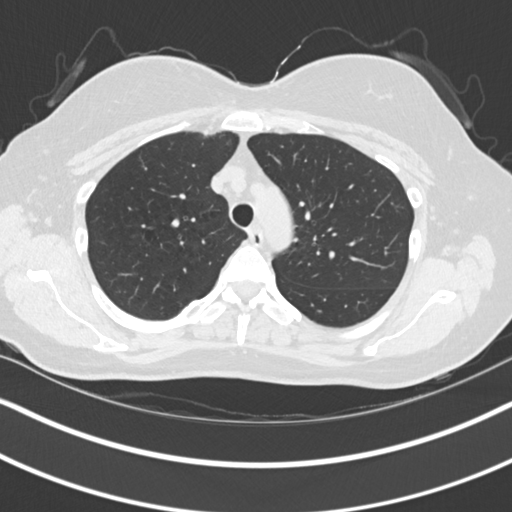
[im 137/172  mediastinal]
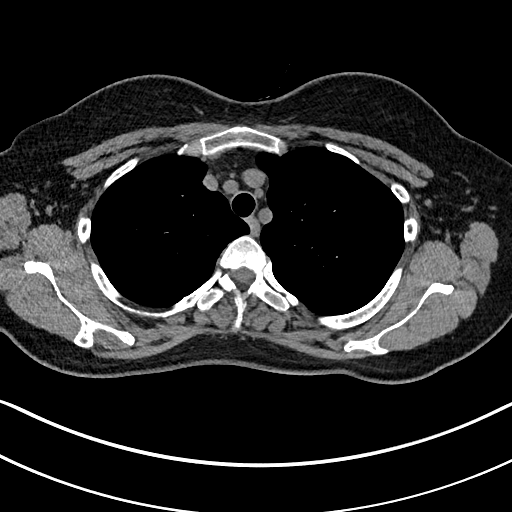
[im 137/172  lung]
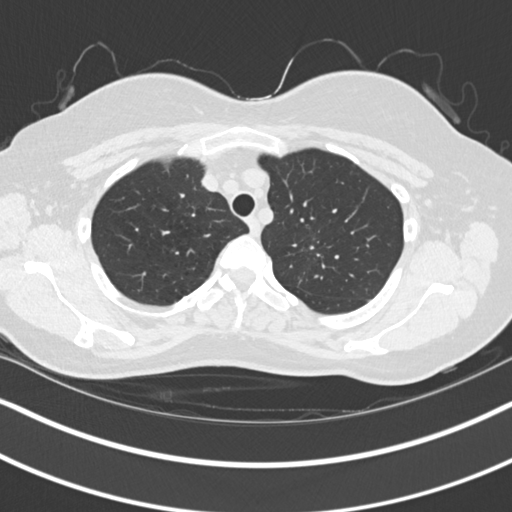
[im 146/172  lung]
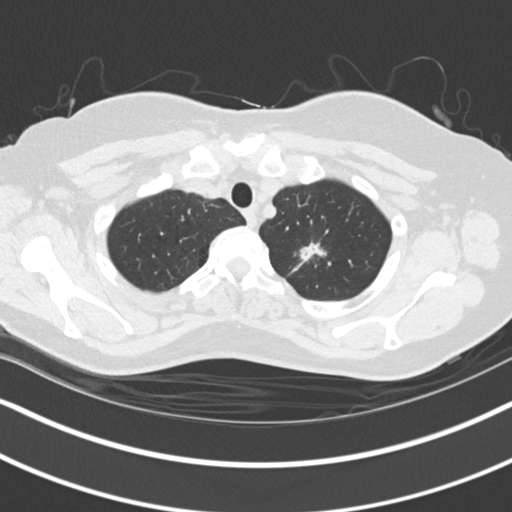
[im 159/172  lung]
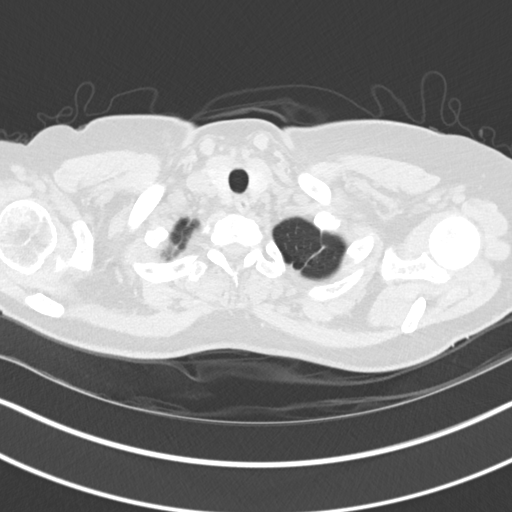

[15 of 34 positions shown; findings below may reference images not displayed]

FINDINGS: Cardiovascular: Somewhat limited due the lack of IV contrast. Aortic
calcifications are noted without aneurysmal dilatation. Coronary
calcifications are seen as well.

Mediastinum/Nodes: The thoracic inlet is within normal limits. Few
scattered small AP window nodes are seen. The largest of these
measures approximately 8 mm in short axis. No definitive hilar
adenopathy is noted. The esophagus as visualize is within normal
limits.

Lungs/Pleura: Emphysematous changes are noted bilaterally. The lungs
are well aerated without focal infiltrate or sizable effusion. In
the left upper lobe there is a spiculated mass lesion which measures
approximately 1.7 cm in greatest dimension on axial imaging
consistent with primary pulmonary neoplasm. On the coronal imaging
the lesion measures approximately 2.7 cm and has some linear density
extending superiorly to the pleural margin. This may represent some
localized extension. No other nodules are identified.

Upper Abdomen: Within normal limits.

Musculoskeletal: No chest wall mass or suspicious bone lesions
identified.
IMPRESSION: Spiculated mass lesion in the left upper lobe as described above
consistent with primary pulmonary neoplasm. Few scattered AP window
nodes are seen. PET-CT is recommended for further evaluation

These results will be called to the ordering clinician or
representative by the Radiologist Assistant, and communication
documented in the PACS or zVision Dashboard.

## 2018-07-10 DIAGNOSIS — Z136 Encounter for screening for cardiovascular disorders: Secondary | ICD-10-CM | POA: Diagnosis not present

## 2018-07-10 DIAGNOSIS — Z8739 Personal history of other diseases of the musculoskeletal system and connective tissue: Secondary | ICD-10-CM | POA: Diagnosis not present

## 2018-07-10 DIAGNOSIS — R5383 Other fatigue: Secondary | ICD-10-CM | POA: Diagnosis not present

## 2018-07-11 DIAGNOSIS — G8929 Other chronic pain: Secondary | ICD-10-CM | POA: Diagnosis not present

## 2018-07-11 DIAGNOSIS — Z0000 Encounter for general adult medical examination without abnormal findings: Secondary | ICD-10-CM | POA: Diagnosis not present

## 2018-07-11 DIAGNOSIS — Z1211 Encounter for screening for malignant neoplasm of colon: Secondary | ICD-10-CM | POA: Diagnosis not present

## 2018-07-11 DIAGNOSIS — N959 Unspecified menopausal and perimenopausal disorder: Secondary | ICD-10-CM | POA: Diagnosis not present

## 2018-07-11 DIAGNOSIS — Z1231 Encounter for screening mammogram for malignant neoplasm of breast: Secondary | ICD-10-CM | POA: Diagnosis not present

## 2018-07-19 DIAGNOSIS — G894 Chronic pain syndrome: Secondary | ICD-10-CM | POA: Diagnosis not present

## 2018-07-19 DIAGNOSIS — M79671 Pain in right foot: Secondary | ICD-10-CM | POA: Diagnosis not present

## 2018-07-19 DIAGNOSIS — M961 Postlaminectomy syndrome, not elsewhere classified: Secondary | ICD-10-CM | POA: Diagnosis not present

## 2018-07-19 DIAGNOSIS — M47816 Spondylosis without myelopathy or radiculopathy, lumbar region: Secondary | ICD-10-CM | POA: Diagnosis not present

## 2018-07-19 DIAGNOSIS — S92911A Unspecified fracture of right toe(s), initial encounter for closed fracture: Secondary | ICD-10-CM | POA: Diagnosis not present

## 2018-07-19 DIAGNOSIS — M47812 Spondylosis without myelopathy or radiculopathy, cervical region: Secondary | ICD-10-CM | POA: Diagnosis not present

## 2018-07-24 DIAGNOSIS — N951 Menopausal and female climacteric states: Secondary | ICD-10-CM | POA: Diagnosis not present

## 2018-07-25 DIAGNOSIS — N951 Menopausal and female climacteric states: Secondary | ICD-10-CM | POA: Diagnosis not present

## 2018-07-25 DIAGNOSIS — R232 Flushing: Secondary | ICD-10-CM | POA: Diagnosis not present

## 2018-08-12 IMAGING — DX DG CHEST 1V PORT
1 series · 1 of 1 positions shown · non-contrast
Comparison: 1 day prior

CLINICAL DATA: Status post lobectomy .  Left-sided chest tube.

EXAM:
PORTABLE CHEST 1 VIEW

[chest ap]
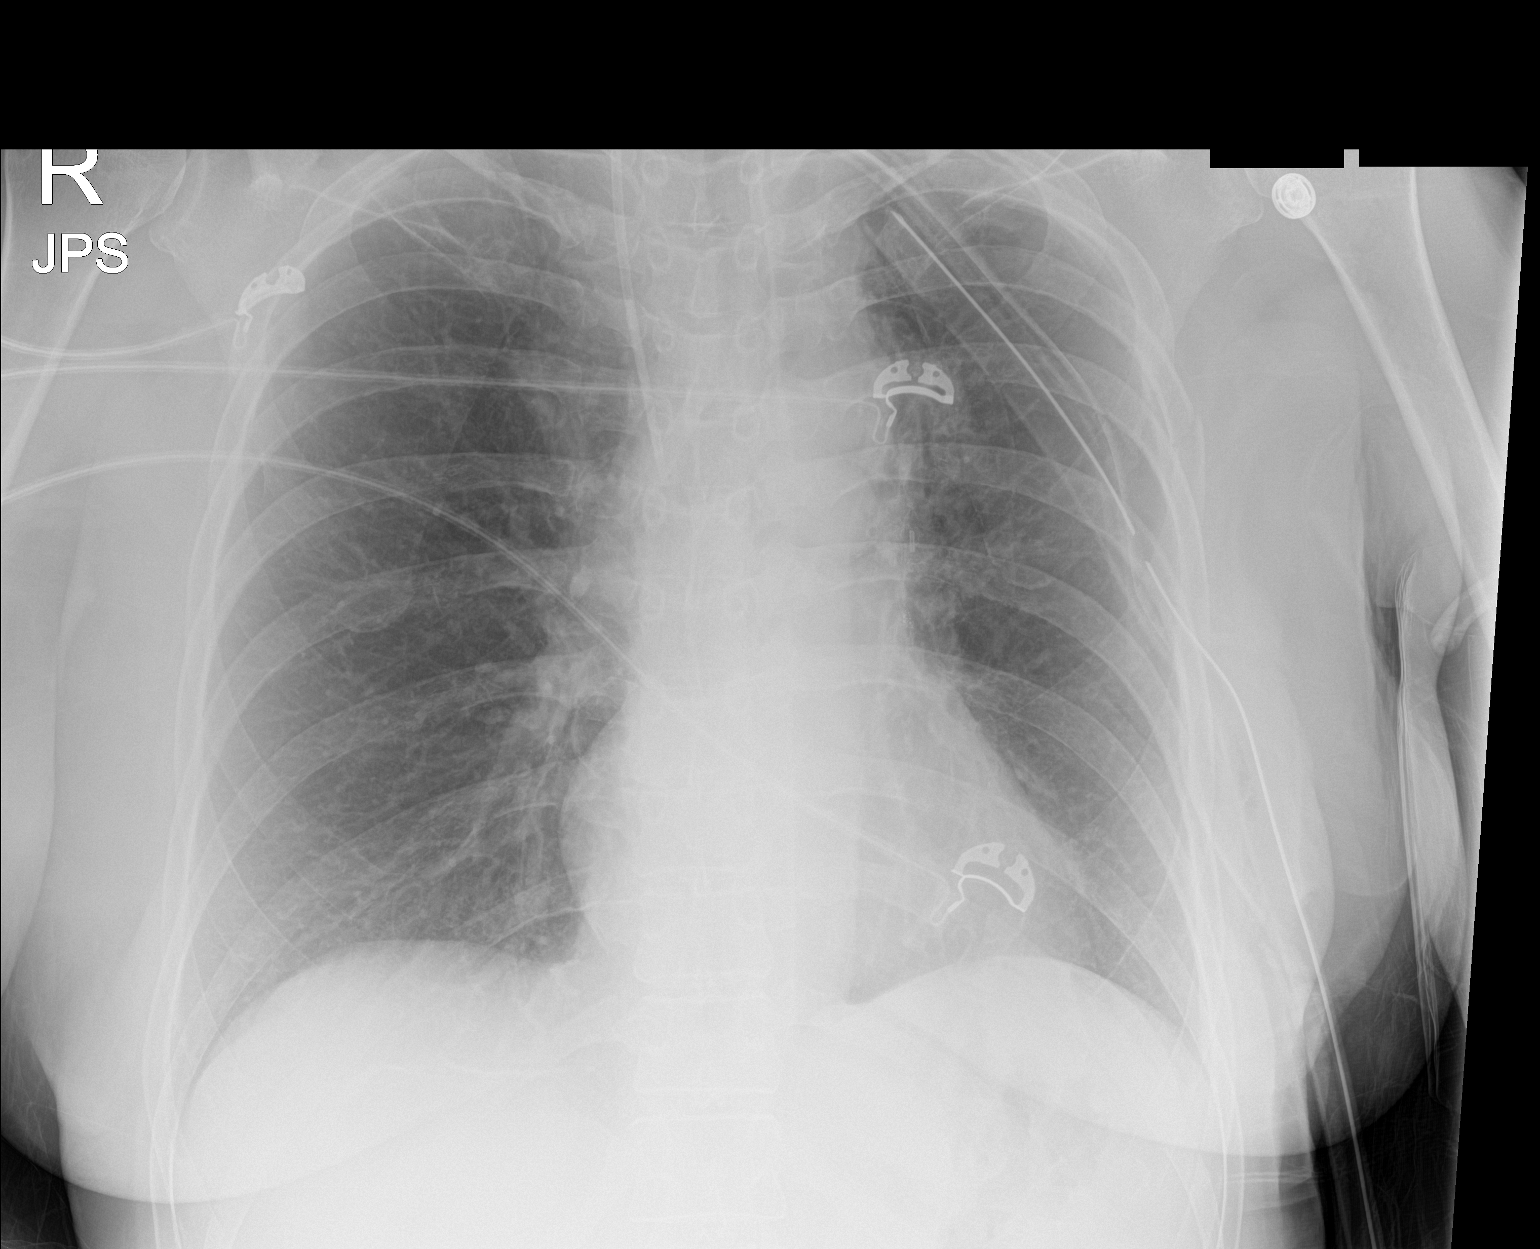

[1 of 1 positions shown; findings below may reference images not displayed]

FINDINGS: Right internal jugular line with tip at low SVC. Patient rotated
minimally left. Midline trachea. Normal heart size. Two left chest
tubes. Approximately 5% left apical pneumothorax, with visceral
pleural line 4 mm from chest wall maximally. Clear lungs.
IMPRESSION: Left chest tubes in place with 5% left apical pneumothorax.

## 2018-08-19 DIAGNOSIS — M47812 Spondylosis without myelopathy or radiculopathy, cervical region: Secondary | ICD-10-CM | POA: Diagnosis not present

## 2018-08-19 DIAGNOSIS — M961 Postlaminectomy syndrome, not elsewhere classified: Secondary | ICD-10-CM | POA: Diagnosis not present

## 2018-08-19 DIAGNOSIS — N951 Menopausal and female climacteric states: Secondary | ICD-10-CM | POA: Diagnosis not present

## 2018-08-19 DIAGNOSIS — M47816 Spondylosis without myelopathy or radiculopathy, lumbar region: Secondary | ICD-10-CM | POA: Diagnosis not present

## 2018-08-19 DIAGNOSIS — R232 Flushing: Secondary | ICD-10-CM | POA: Diagnosis not present

## 2018-08-19 DIAGNOSIS — G894 Chronic pain syndrome: Secondary | ICD-10-CM | POA: Diagnosis not present

## 2018-08-20 DIAGNOSIS — R232 Flushing: Secondary | ICD-10-CM | POA: Diagnosis not present

## 2018-08-20 DIAGNOSIS — N951 Menopausal and female climacteric states: Secondary | ICD-10-CM | POA: Diagnosis not present

## 2018-09-02 DIAGNOSIS — G8929 Other chronic pain: Secondary | ICD-10-CM | POA: Diagnosis not present

## 2018-09-02 DIAGNOSIS — J019 Acute sinusitis, unspecified: Secondary | ICD-10-CM | POA: Diagnosis not present

## 2018-09-05 DIAGNOSIS — Z79899 Other long term (current) drug therapy: Secondary | ICD-10-CM | POA: Diagnosis not present

## 2018-09-05 DIAGNOSIS — M5417 Radiculopathy, lumbosacral region: Secondary | ICD-10-CM | POA: Diagnosis not present

## 2018-09-06 ENCOUNTER — Other Ambulatory Visit: Payer: Self-pay | Admitting: Cardiothoracic Surgery

## 2018-09-06 DIAGNOSIS — C3490 Malignant neoplasm of unspecified part of unspecified bronchus or lung: Secondary | ICD-10-CM

## 2018-09-06 DIAGNOSIS — R51 Headache: Secondary | ICD-10-CM | POA: Diagnosis not present

## 2018-09-16 DIAGNOSIS — G894 Chronic pain syndrome: Secondary | ICD-10-CM | POA: Diagnosis not present

## 2018-09-16 DIAGNOSIS — M47812 Spondylosis without myelopathy or radiculopathy, cervical region: Secondary | ICD-10-CM | POA: Diagnosis not present

## 2018-09-16 DIAGNOSIS — M47816 Spondylosis without myelopathy or radiculopathy, lumbar region: Secondary | ICD-10-CM | POA: Diagnosis not present

## 2018-09-16 DIAGNOSIS — M961 Postlaminectomy syndrome, not elsewhere classified: Secondary | ICD-10-CM | POA: Diagnosis not present

## 2018-10-03 DIAGNOSIS — Z1231 Encounter for screening mammogram for malignant neoplasm of breast: Secondary | ICD-10-CM | POA: Diagnosis not present

## 2018-10-10 DIAGNOSIS — M47816 Spondylosis without myelopathy or radiculopathy, lumbar region: Secondary | ICD-10-CM | POA: Diagnosis not present

## 2018-10-17 DIAGNOSIS — M47816 Spondylosis without myelopathy or radiculopathy, lumbar region: Secondary | ICD-10-CM | POA: Diagnosis not present

## 2018-10-17 DIAGNOSIS — Z79891 Long term (current) use of opiate analgesic: Secondary | ICD-10-CM | POA: Diagnosis not present

## 2018-10-23 DIAGNOSIS — K219 Gastro-esophageal reflux disease without esophagitis: Secondary | ICD-10-CM | POA: Diagnosis not present

## 2018-10-23 DIAGNOSIS — G43009 Migraine without aura, not intractable, without status migrainosus: Secondary | ICD-10-CM | POA: Diagnosis not present

## 2018-10-23 DIAGNOSIS — J449 Chronic obstructive pulmonary disease, unspecified: Secondary | ICD-10-CM | POA: Diagnosis not present

## 2018-10-23 DIAGNOSIS — M797 Fibromyalgia: Secondary | ICD-10-CM | POA: Diagnosis not present

## 2018-10-24 ENCOUNTER — Other Ambulatory Visit: Payer: Self-pay

## 2018-10-24 ENCOUNTER — Ambulatory Visit: Payer: Self-pay | Admitting: Cardiothoracic Surgery

## 2018-10-24 DIAGNOSIS — M47816 Spondylosis without myelopathy or radiculopathy, lumbar region: Secondary | ICD-10-CM | POA: Diagnosis not present

## 2018-11-07 ENCOUNTER — Ambulatory Visit
Admission: RE | Admit: 2018-11-07 | Discharge: 2018-11-07 | Disposition: A | Discharge status: Home / Self Care | Payer: Medicare Other | Source: Ambulatory Visit | Attending: Cardiothoracic Surgery | Admitting: Cardiothoracic Surgery

## 2018-11-07 ENCOUNTER — Ambulatory Visit: Payer: Medicare Other | Admitting: Cardiothoracic Surgery

## 2018-11-07 VITALS — BP 100/73 | HR 92 | Resp 20 | Ht 67.0 in | Wt 170.0 lb

## 2018-11-07 DIAGNOSIS — C3492 Malignant neoplasm of unspecified part of left bronchus or lung: Secondary | ICD-10-CM

## 2018-11-07 DIAGNOSIS — C3490 Malignant neoplasm of unspecified part of unspecified bronchus or lung: Secondary | ICD-10-CM

## 2018-11-07 DIAGNOSIS — Z85118 Personal history of other malignant neoplasm of bronchus and lung: Secondary | ICD-10-CM

## 2018-11-07 DIAGNOSIS — Z902 Acquired absence of lung [part of]: Secondary | ICD-10-CM | POA: Diagnosis not present

## 2018-11-07 NOTE — Progress Notes (Signed)
[address]       [place] [address]             [phone]                  Arian L Mcomber [place] Medical Record #[id number] Date of Birth: 03/01/1956  Referring XB:[id number], Ronie Spies, MD Primary Cardiology: Primary Care:Pharr, Thayer Jew, MD  Chief Complaint:  Follow Up Visit 10/17/2016 OPERATIVE REPORT PREOPERATIVE DIAGNOSIS: Left upper lobe lung mass. POSTOPERATIVE DIAGNOSIS: Adenocarcinoma of the left upper lobe. PROCEDURE PERFORMED: Video bronchoscopy, left video-assisted thoracoscopy, mini thoracotomy, wedge resection of left upper lobe, completion of left upper lobectomy with lymph node dissection and placement of On-Q device, and lymph node dissection. SURGEON: Lanelle Bal, M.D.   Cancer Staging Lung cancer, left upper lobe ([place]) Staging form: Lung, AJCC 7th Edition - Pathologic stage from 10/17/2016: Stage IA (T1b, N0, cM0) - Signed by [name], MD on 10/18/2016   History of Present Illness:     Patient returns to the office for follow-up after left upper lobectomy for stage Ia adenocarcinoma resected in November 2017, now approximately 2 years postop.  Fortunately she continues to smoke but to a lesser greater degree than previously.  She is now living in [place] and transitioning her care there.      Zubrod Score: At the time of surgery this patient's most appropriate activity status/level should be described as: [x]     0    Normal activity, no symptoms []     1    Restricted in physical strenuous activity but ambulatory, able to do out light work []     2    Ambulatory and capable of self care, unable to do work activities, up and about                 >50 % of waking hours                                                                                   []     3    Only limited self care, in bed greater than 50% of waking hours []     4    Completely disabled, no self care, confined to bed or  chair []     5    Moribund  Social History   Tobacco Use  Smoking Status Current Some Day Smoker  . Packs/day: 0.00  . Years: 30.00  . Pack years: 0.00  . Last attempt to quit: 09/03/2016  . Years since quitting: 2.1  Smokeless Tobacco Never Used  Tobacco Comment   ONLY USING VAPE        Allergies  Allergen Reactions  . Carafate [Sucralfate] Hives and Other (See Comments)    CHEST PAIN    . Cefaclor Hives and Other (See Comments)    CHEST PAIN    . Depakote [Divalproex Sodium] Hives and Other (See Comments)    CHEST PAIN    . Lamictal [Lamotrigine] Other (See Comments)    Chest pain.  . Nitrofurantoin Hives    CHEST PAIN   . Sulfamethoxazole Hives    CHEST PAIN    .  Wellbutrin [Bupropion] Hives and Other (See Comments)    CHEST PAIN   . Abilify [Aripiprazole] Nausea Only    Current Outpatient Medications  Medication Sig Dispense Refill  . amitriptyline (ELAVIL) 50 MG tablet Take 50 mg by mouth at bedtime.    . Ascorbic Acid (VITAMIN C) 1000 MG tablet Take 1,000 mg by mouth daily.     Marland Kitchen aspirin EC 81 MG tablet Take 81 mg by mouth daily.    . Calcium Carbonate-Vit D-Min (CALCIUM 1200 PO) Take 1,200 mg by mouth 2 (two) times daily.    . cetirizine (ZYRTEC) 10 MG tablet Take 10 mg by mouth daily as needed for allergies (alternates between zyrtec and claritin weekly).     . COMBIVENT RESPIMAT 20-100 MCG/ACT AERS respimat INHALE 2 PUFFS INTO THE LUNGS 4 TIMES DAILY AS NEEDED FOR WHEEZING 3 Inhaler 1  . dexlansoprazole (DEXILANT) 60 MG capsule Take 60 mg by mouth daily.    Marland Kitchen docusate sodium (COLACE) 100 MG capsule Take 100 mg by mouth 2 (two) times daily as needed for constipation.    Marland Kitchen FIORICET 50-300-40 MG CAPS 1 to 2 every 4-6 hrs as needed (Patient taking differently: Take 1-2 tablets by mouth every 4 (four) hours as needed (migraines). ) 60 capsule 0  . Ginger, Zingiber officinalis, (GINGER ROOT) 550 MG CAPS Take 550 mg by mouth 2 (two) times daily as needed (nausea).      . loratadine (CLARITIN) 10 MG tablet Take 10 mg by mouth daily as needed for allergies (Alternates between zyrtec and claritin weekly).     . methocarbamol (ROBAXIN) 750 MG tablet Take 750 mg by mouth every 6 (six) hours as needed for muscle spasms.    . Multiple Vitamin (MULTIVITAMIN WITH MINERALS) TABS Take 1 tablet by mouth daily.    Marland Kitchen oxyCODONE-acetaminophen (PERCOCET) 7.5-325 MG per tablet Take 1 tablet by mouth every 4 (four) hours as needed for moderate pain.     . pregabalin (LYRICA) 150 MG capsule Take 150 mg by mouth 2 (two) times daily.    Marland Kitchen pyridoxine (B-6) 100 MG tablet Take 100 mg by mouth 2 (two) times daily.    . ranitidine (ZANTAC) 150 MG tablet Take 150 mg by mouth 2 (two) times daily as needed. If dexilant doesn't work    . triamcinolone ointment (KENALOG) 0.1 % Apply 1 application topically daily as needed.     . TURMERIC PO Take 550 mg by mouth 3 (three) times daily.     Marland Kitchen venlafaxine (EFFEXOR) 75 MG tablet Take 150 mg 2 (two) times daily by mouth.      No current facility-administered medications for this visit.        Physical Exam: BP 100/73   Pulse 92   Resp 20   Ht 5\' 7"  (1.702 m)   Wt 170 lb (77.1 kg)   LMP 09/07/2006 (Within Years) Comment: >10 years  SpO2 96% Comment: RA  BMI 26.63 kg/m  General appearance: alert and cooperative Head: Normocephalic, without obvious abnormality, atraumatic Neck: no adenopathy, no carotid bruit, no JVD, supple, symmetrical, trachea midline and thyroid not enlarged, symmetric, no tenderness/mass/nodules Lymph nodes: Cervical, supraclavicular, and axillary nodes normal. Resp: clear to auscultation bilaterally Back: symmetric, no curvature. ROM normal. No CVA tenderness. Cardio: regular rate and rhythm, S1, S2 normal, no murmur, click, rub or gallop GI: soft, non-tender; bowel sounds normal; no masses,  no organomegaly Extremities: extremities normal, atraumatic, no cyanosis or edema Neurologic: Grossly  normal  Diagnostic Studies &  Laboratory data:         Recent Radiology Findings: Ct Chest Wo Contrast  Result Date: 11/07/2018 CLINICAL DATA:  Lung cancer, status post left upper lobectomy EXAM: CT CHEST WITHOUT CONTRAST TECHNIQUE: Multidetector CT imaging of the chest was performed following the standard protocol without IV contrast. COMPARISON:  04/18/2018 FINDINGS: Cardiovascular: Heart is normal in size.  No pericardial effusion. No evidence of thoracic aortic aneurysm. Mild atherosclerotic calcifications of the aortic arch. Three vessel coronary atherosclerosis. Mediastinum/Nodes: Small mediastinal lymph nodes, including a 7 mm short axis low right paratracheal node, grossly unchanged. Visualized thyroid is unremarkable. Lungs/Pleura: Status post left upper lobectomy. Mild centrilobular emphysematous changes, right upper lobe predominant. No suspicious pulmonary nodules. No focal consolidation. No pleural effusion or pneumothorax. Upper Abdomen: Visualized upper abdomen is grossly unremarkable, noting prior cholecystectomy and vascular calcifications. Musculoskeletal: Visualized osseous structures are within normal limits. IMPRESSION: Status post left upper lobectomy. No evidence of recurrent or metastatic disease. Aortic Atherosclerosis (ICD10-I70.0) and Emphysema (ICD10-J43.9). Electronically Signed   By: Julian Hy M.D.   On: 11/07/2018 12:12      I have independently reviewed the above radiology findings and reviewed findings  with the patient.  Recent Labs: Lab Results  Component Value Date   WBC 9.6 10/19/2016   HGB 12.8 10/19/2016   HCT 39.2 10/19/2016   PLT 207 10/19/2016   GLUCOSE 117 (H) 10/19/2016   CHOL 182 04/09/2007   TRIG 68 04/09/2007   HDL 51 04/09/2007   LDLCALC 117 04/09/2007   ALT 15 10/19/2016   AST 24 10/19/2016   NA 138 10/19/2016   K 4.1 10/19/2016   CL 104 10/19/2016   CREATININE 0.64 10/19/2016   BUN 5 (L) 10/19/2016   CO2 29 10/19/2016   INR 1.05  10/16/2016      Assessment / Plan:       Patient without evidence of recurrence of stage I adenocarcinoma resected from the left upper lobe 24 months ago Again reviewed with her the need to stop smoking and discussed various techniques  Plan to see her back in 12  months with a follow-up CT of the chest   [name] 11/07/2018 2:13 PM

## 2018-11-14 DIAGNOSIS — M47816 Spondylosis without myelopathy or radiculopathy, lumbar region: Secondary | ICD-10-CM | POA: Diagnosis not present

## 2018-11-14 DIAGNOSIS — Z79891 Long term (current) use of opiate analgesic: Secondary | ICD-10-CM | POA: Diagnosis not present

## 2018-11-21 DIAGNOSIS — M47817 Spondylosis without myelopathy or radiculopathy, lumbosacral region: Secondary | ICD-10-CM | POA: Diagnosis not present

## 2018-12-12 DIAGNOSIS — M47816 Spondylosis without myelopathy or radiculopathy, lumbar region: Secondary | ICD-10-CM | POA: Diagnosis not present

## 2018-12-12 DIAGNOSIS — Z79891 Long term (current) use of opiate analgesic: Secondary | ICD-10-CM | POA: Diagnosis not present

## 2019-10-20 ENCOUNTER — Other Ambulatory Visit: Payer: Self-pay | Admitting: *Deleted

## 2019-10-20 DIAGNOSIS — Z85118 Personal history of other malignant neoplasm of bronchus and lung: Secondary | ICD-10-CM

## 2019-11-13 ENCOUNTER — Other Ambulatory Visit: Payer: Medicare Other

## 2019-11-13 ENCOUNTER — Encounter: Payer: Medicare Other | Admitting: Cardiothoracic Surgery
